# Patient Record
Sex: Male | Born: 1985 | Race: White | Hispanic: No | Marital: Single | Smoking: Former smoker
Health system: Southern US, Community
[De-identification: ages and names within clinical notes are randomized; demographics above are authoritative.]

## PROBLEM LIST (undated history)

## (undated) DIAGNOSIS — F17200 Nicotine dependence, unspecified, uncomplicated: Secondary | ICD-10-CM

## (undated) DIAGNOSIS — M1990 Unspecified osteoarthritis, unspecified site: Secondary | ICD-10-CM

## (undated) DIAGNOSIS — F988 Other specified behavioral and emotional disorders with onset usually occurring in childhood and adolescence: Secondary | ICD-10-CM

## (undated) HISTORY — DX: Other specified behavioral and emotional disorders with onset usually occurring in childhood and adolescence: F98.8

## (undated) HISTORY — DX: Nicotine dependence, unspecified, uncomplicated: F17.200

---
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
## 2005-04-26 ENCOUNTER — Emergency Department (HOSPITAL_COMMUNITY): Admission: EM | Admit: 2005-04-26 | Discharge: 2005-04-26 | Payer: Self-pay | Admitting: Emergency Medicine

## 2005-12-15 ENCOUNTER — Ambulatory Visit (HOSPITAL_COMMUNITY): Admission: RE | Admit: 2005-12-15 | Discharge: 2005-12-15 | Payer: Self-pay | Admitting: Chiropractic Medicine

## 2006-01-30 ENCOUNTER — Ambulatory Visit: Payer: Self-pay | Admitting: Family Medicine

## 2007-02-01 ENCOUNTER — Ambulatory Visit: Payer: Self-pay | Admitting: Family Medicine

## 2009-12-03 ENCOUNTER — Emergency Department (HOSPITAL_COMMUNITY): Admission: EM | Admit: 2009-12-03 | Discharge: 2009-12-03 | Payer: Self-pay | Admitting: Emergency Medicine

## 2010-04-03 ENCOUNTER — Ambulatory Visit (HOSPITAL_COMMUNITY)
Admission: RE | Admit: 2010-04-03 | Discharge: 2010-04-03 | Disposition: A | Discharge status: Home / Self Care | Payer: No Typology Code available for payment source | Source: Ambulatory Visit | Attending: Family Medicine | Admitting: Family Medicine

## 2010-04-03 ENCOUNTER — Inpatient Hospital Stay (INDEPENDENT_AMBULATORY_CARE_PROVIDER_SITE_OTHER)
Admission: RE | Admit: 2010-04-03 | Discharge: 2010-04-03 | Disposition: A | Discharge status: Home / Self Care | Payer: No Typology Code available for payment source | Source: Ambulatory Visit | Attending: Internal Medicine | Admitting: Internal Medicine

## 2010-04-03 DIAGNOSIS — M545 Low back pain, unspecified: Secondary | ICD-10-CM | POA: Insufficient documentation

## 2010-04-03 DIAGNOSIS — S335XXA Sprain of ligaments of lumbar spine, initial encounter: Secondary | ICD-10-CM

## 2010-04-08 ENCOUNTER — Ambulatory Visit (INDEPENDENT_AMBULATORY_CARE_PROVIDER_SITE_OTHER): Payer: Self-pay | Admitting: Family Medicine

## 2010-04-08 DIAGNOSIS — M549 Dorsalgia, unspecified: Secondary | ICD-10-CM

## 2010-04-08 DIAGNOSIS — M542 Cervicalgia: Secondary | ICD-10-CM

## 2010-07-16 NOTE — Procedures (Signed)
HISTORY:  This is a 25 year old patient who is being evaluated for altered  mental status, skull fracture.  This is a portable EEG recording. No skull defects were noted.   MEDICATIONS:  Protonix, Hydrocodone, Dilantin, Tylenol, Zofran.   EEG CLASSIFICATION:  Dysrhythmia grade 1, generalized.   DESCRIPTION OF PROCEDURE:  According to the background rhythms, this  recording consists at time of an 8 to 9 hertz background but at other times  7 hertz background slowing seen. This is a technically difficult study due  to significant head movement artifact, instability at baseline recording for  this reason. There appears to be rapid changes from what appears to be  drowsy/stage 2 sleep to a waking state. At no time during the recording does  there appear to be evidence of obvious spike or spike wave discharges or  other epileptiform discharges.  EKG monitor shows no evidence of cardiac rhythm abnormalities, the heart  rate is 60.  Photic stimulation and hyperventilation were not performed during the  recording.   IMPRESSION:  This is a minimally abnormal EEG recording due to some  generalized background slowing seen. This study does seem to include some  drowsiness and sleeping state. This is a technically difficult study due to  excessive head movement artifact. No epileptiform discharges are seen.       WJX:BJYN  D:  08/27/2004 17:58:19  T:  08/27/2004 20:36:19  Job #:  [id number]
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
## 2010-09-30 ENCOUNTER — Encounter: Payer: Self-pay | Admitting: Family Medicine

## 2012-12-27 ENCOUNTER — Encounter (HOSPITAL_COMMUNITY): Payer: Self-pay | Admitting: Emergency Medicine

## 2012-12-27 ENCOUNTER — Emergency Department (INDEPENDENT_AMBULATORY_CARE_PROVIDER_SITE_OTHER)
Admission: EM | Admit: 2012-12-27 | Discharge: 2012-12-27 | Disposition: A | Discharge status: Home / Self Care | Payer: Self-pay | Source: Home / Self Care | Attending: Family Medicine | Admitting: Family Medicine

## 2012-12-27 DIAGNOSIS — M545 Low back pain: Secondary | ICD-10-CM

## 2012-12-27 MED ORDER — PREDNISONE 10 MG PO KIT: PACK | ORAL | Status: DC

## 2012-12-27 MED ORDER — CYCLOBENZAPRINE HCL 10 MG PO TABS: 10.0000 mg | ORAL_TABLET | Freq: Every evening | ORAL | Status: DC | PRN

## 2012-12-27 MED ORDER — OXYCODONE-ACETAMINOPHEN 5-325 MG PO TABS: 1.0000 | ORAL_TABLET | Freq: Four times a day (QID) | ORAL | Status: DC | PRN

## 2012-12-27 NOTE — ED Notes (Signed)
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 Pain increases w/activity Took vicodin that his mother gave him for the pain w/no relief He is alert w/no signs of acute distress.

## 2012-12-27 NOTE — ED Provider Notes (Signed)
[name] is a 27 y.o. male who presents to Urgent Care today for severe acute low back pain. Patient was attempting to lift a heavy machine yesterday when he felt a sudden pulling sensation in his right low back. He has had subsequent severe low back pain since. He denies any radiating pain weakness numbness bowel bladder dysfunction. He's tried some over-the-counter pain medications as well as some of his mother's Norco which have helped only a little. He has never had pain like this. He feels well otherwise. He is unable to work currently because of pain. Activity makes the pain worse.   Past Medical History  Diagnosis Date  . ADD (attention deficit disorder)   . Smoker    History  Substance Use Topics  . Smoking status: Current Every Day Smoker  . Smokeless tobacco: Not on file  . Alcohol Use: No   ROS as above Medications reviewed. No current facility-administered medications for this encounter.   Current Outpatient Prescriptions  Medication Sig Dispense Refill  . cyclobenzaprine (FLEXERIL) 10 MG tablet Take 1 tablet (10 mg total) by mouth at bedtime as needed for muscle spasms.  20 tablet  0  . oxyCODONE-acetaminophen (PERCOCET/ROXICET) 5-325 MG per tablet Take 1 tablet by mouth every 6 (six) hours as needed for pain.  15 tablet  0  . PredniSONE 10 MG KIT 12 day dose pack po  1 kit  0    Exam:  BP 146/86  Pulse 60  Temp(Src) 98.1 F (36.7 C) (Oral)  Resp 18  SpO2 98% Gen: Well NAD HEENT: EOMI,  MMM Lungs: CTABL Nl WOB Heart: RRR no MRG Abd: NABS, NT, ND Exts: Non edematous BL  LE, warm and well perfused.  Back: Nontender to spinal midline. Tender to palpation right SI joint. Negative straight leg raise test. Followup positive Pearlean Brownie test on right. Strength is intact bilateral lower extremities. Normal hip flexion knee flexion extension and ankle dorsiflexion plantar flexion bilaterally. Patient can stand on toes, heels and squat normally. Range of motion is  reduced with flexion and extension due to pain. Normal rotation. Reflexes are normal and equal bilateral knees and ankles appear Sensation is intact throughout Pulses and capillary refill are intact throughout. Patient walks with an antalgic gait.  No results found for this or any previous visit (from the past 24 hour(s)). No results found.  Assessment and Plan: 27 y.o. male with right lumbago.  Plan to treat with prednisone, Flexeril, oxycodone.  Additional is a heating pad and home exercises. Followup if not improving. Work note provided. Discussed warning signs or symptoms. Please see discharge instructions. Patient expresses understanding. Specifically reviewed incontinence and weakness it is red flag signs or symptoms.      Rodolph Bong, MD 12/27/12 [phone]

## 2013-05-13 ENCOUNTER — Encounter (HOSPITAL_COMMUNITY): Payer: Self-pay | Admitting: Emergency Medicine

## 2013-05-13 ENCOUNTER — Emergency Department (HOSPITAL_COMMUNITY)
Admission: EM | Admit: 2013-05-13 | Discharge: 2013-05-13 | Disposition: A | Discharge status: Home / Self Care | Payer: BC Managed Care – PPO | Source: Home / Self Care | Attending: Emergency Medicine | Admitting: Emergency Medicine

## 2013-05-13 DIAGNOSIS — M25519 Pain in unspecified shoulder: Secondary | ICD-10-CM

## 2013-05-13 MED ORDER — METHYLPREDNISOLONE ACETATE 40 MG/ML IJ SUSP
80.0000 mg | Freq: Once | INTRAMUSCULAR | Status: AC
  Administered 2013-05-13: 80 mg via INTRAMUSCULAR

## 2013-05-13 MED ORDER — ETODOLAC 500 MG PO TABS: 500.0000 mg | ORAL_TABLET | Freq: Two times a day (BID) | ORAL | Status: DC

## 2013-05-13 MED ORDER — METHYLPREDNISOLONE ACETATE 80 MG/ML IJ SUSP
INTRAMUSCULAR | Status: AC
  Filled 2013-05-13: qty 1

## 2013-05-13 MED ORDER — KETOROLAC TROMETHAMINE 60 MG/2ML IM SOLN
60.0000 mg | Freq: Once | INTRAMUSCULAR | Status: AC
  Administered 2013-05-13: 60 mg via INTRAMUSCULAR

## 2013-05-13 MED ORDER — KETOROLAC TROMETHAMINE 60 MG/2ML IM SOLN
INTRAMUSCULAR | Status: AC
  Filled 2013-05-13: qty 2

## 2013-05-13 NOTE — Discharge Instructions (Signed)
   Shoulder Pain The shoulder is the joint that connects your arms to your body. The bones that form the shoulder joint include the upper arm bone (humerus), the shoulder blade (scapula), and the collarbone (clavicle). The top of the humerus is shaped like a ball and fits into a rather flat socket on the scapula (glenoid cavity). A combination of muscles and strong, fibrous tissues that connect muscles to bones (tendons) support your shoulder joint and hold the ball in the socket. Small, fluid-filled sacs (bursae) are located in different areas of the joint. They act as cushions between the bones and the overlying soft tissues and help reduce friction between the gliding tendons and the bone as you move your arm. Your shoulder joint allows a wide range of motion in your arm. This range of motion allows you to do things like scratch your back or throw a ball. However, this range of motion also makes your shoulder more prone to pain from overuse and injury. Causes of shoulder pain can originate from both injury and overuse and usually can be grouped in the following four categories:  Redness, swelling, and pain (inflammation) of the tendon (tendinitis) or the bursae (bursitis).  Instability, such as a dislocation of the joint.  Inflammation of the joint (arthritis).  Broken bone (fracture). HOME CARE INSTRUCTIONS   Apply ice to the sore area.  Put ice in a plastic bag.  Place a towel between your skin and the bag.  Leave the ice on for 15-20 minutes, 03-04 times per day for the first 2 days.  Stop using cold packs if they do not help with the pain.  If you have a shoulder sling or immobilizer, wear it as long as your caregiver instructs. Only remove it to shower or bathe. Move your arm as little as possible, but keep your hand moving to prevent swelling.  Squeeze a soft ball or foam pad as much as possible to help prevent swelling.  Only take over-the-counter or prescription medicines for  pain, discomfort, or fever as directed by your caregiver. SEEK MEDICAL CARE IF:   Your shoulder pain increases, or new pain develops in your arm, hand, or fingers.  Your hand or fingers become cold and numb.  Your pain is not relieved with medicines. SEEK IMMEDIATE MEDICAL CARE IF:   Your arm, hand, or fingers are numb or tingling.  Your arm, hand, or fingers are significantly swollen or turn white or blue. MAKE SURE YOU:   Understand these instructions.  Will watch your condition.  Will get help right away if you are not doing well or get worse. Document Released: 11/24/2004 Document Revised: 11/09/2011 Document Reviewed: 01/29/2011 [employer] Patient Information 2014 [employer].  

## 2013-05-13 NOTE — ED Provider Notes (Signed)
Medical screening examination/treatment/procedure(s) were performed by non-physician practitioner and as supervising physician I was immediately available for consultation/collaboration.  Jaime Dome, M.D.  Jeoffrey Eleazer C Jalessa Peyser, MD 05/13/13 [id number] 
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
## 2013-05-13 NOTE — ED Provider Notes (Signed)
CSN: [id number]     Arrival date & time 05/13/13  1703 History   First MD Initiated Contact with Patient 05/13/13 1900     Chief Complaint  Patient presents with  . Arm Pain   (Consider location/radiation/quality/duration/timing/severity/associated sxs/prior Treatment) HPI Comments: 28 year old male presents complaining of left shoulder pain. This pain has been present for 3 weeks. 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 He has pain in the shoulder that is much worse with any overhead movement. He denies any numbness or weakness in the arm. He denies any specific injury apart from possible overuse injury. He has a history of injuring his rotator cuff and shoulder many years ago, this was treated by his orthopedist, he does not currently have an orthopedic physician.  Patient is a 28 y.o. male presenting with arm pain.  Arm Pain Pertinent negatives include no chest pain, no abdominal pain and no shortness of breath.    Past Medical History  Diagnosis Date  . ADD (attention deficit disorder)   . Smoker    History reviewed. No pertinent past surgical history. Family History  Problem Relation Age of Onset  . Hypertension Mother   . Arthritis Maternal Grandmother   . Hypertension Maternal Grandmother   . Arthritis Maternal Grandfather   . Cancer Maternal Grandfather   . Hypertension Paternal Grandmother    History  Substance Use Topics  . Smoking status: Current Every Day Smoker  . Smokeless tobacco: Not on file  . Alcohol Use: No    Review of Systems  Constitutional: Negative for fever, chills and fatigue.  HENT: Negative for sore throat.   Eyes: Negative for visual disturbance.  Respiratory: Negative for cough and shortness of breath.   Cardiovascular: Negative for chest pain, palpitations and leg swelling.  Gastrointestinal: Negative for nausea, vomiting, abdominal pain, diarrhea and constipation.  Genitourinary: Negative for dysuria, urgency,  frequency and hematuria.  Musculoskeletal: Positive for arthralgias and neck pain. Negative for myalgias and neck stiffness.  Skin: Negative for rash.  Neurological: Negative for dizziness, weakness and light-headedness.    Allergies  Review of patient's allergies indicates no known allergies.  Home Medications   Current Outpatient Rx  Name  Route  Sig  Dispense  Refill  . cyclobenzaprine (FLEXERIL) 10 MG tablet   Oral   Take 1 tablet (10 mg total) by mouth at bedtime as needed for muscle spasms.   20 tablet   0   . etodolac (LODINE) 500 MG tablet   Oral   Take 1 tablet (500 mg total) by mouth 2 (two) times daily.   30 tablet   1   . oxyCODONE-acetaminophen (PERCOCET/ROXICET) 5-325 MG per tablet   Oral   Take 1 tablet by mouth every 6 (six) hours as needed for pain.   15 tablet   0   . PredniSONE 10 MG KIT      12 day dose pack po   1 kit   0    BP 135/82  Pulse 79  Temp(Src) 98 F (36.7 C) (Oral)  Resp 20  SpO2 97% Physical Exam  Nursing note and vitals reviewed. Constitutional: He is oriented to person, place, and time. He appears well-developed and well-nourished. No distress.  HENT:  Head: Normocephalic.  Pulmonary/Chest: Effort normal. No respiratory distress.  Musculoskeletal:       Left shoulder: He exhibits decreased range of motion (Decreased internal and external rotation and abduction past 90), tenderness (shoulder girdle) and pain. He exhibits no  bony tenderness, no swelling, no effusion, no deformity and no spasm.  Positive for shoulder apprehension test  Neurological: He is alert and oriented to person, place, and time. Coordination normal.  Skin: Skin is warm and dry. No rash noted. He is not diaphoretic.  Psychiatric: He has a normal mood and affect. Judgment normal.    ED Course  Procedures (including critical care time) Labs Review Labs Reviewed - No data to display Imaging Review No results found.   MDM   1. Shoulder pain     Given Toradol and Depo-Medrol here, start on twice a day Lodine, followup with orthopedics in a week.  Meds ordered this encounter  Medications  . ketorolac (TORADOL) injection 60 mg    Sig:   . methylPREDNISolone acetate (DEPO-MEDROL) injection 80 mg    Sig:   . etodolac (LODINE) 500 MG tablet    Sig: Take 1 tablet (500 mg total) by mouth 2 (two) times daily.    Dispense:  30 tablet    Refill:  1    Order Specific Question:  Supervising Provider    Answer:  Ihor Gully D [place], PA-C 05/13/13 [phone]

## 2013-08-21 ENCOUNTER — Encounter (HOSPITAL_COMMUNITY): Payer: Self-pay | Admitting: Emergency Medicine

## 2013-08-21 ENCOUNTER — Emergency Department (HOSPITAL_COMMUNITY)
Admission: EM | Admit: 2013-08-21 | Discharge: 2013-08-21 | Disposition: A | Discharge status: Home / Self Care | Payer: BC Managed Care – PPO | Source: Home / Self Care | Attending: Emergency Medicine | Admitting: Emergency Medicine

## 2013-08-21 DIAGNOSIS — G2581 Restless legs syndrome: Secondary | ICD-10-CM

## 2013-08-21 DIAGNOSIS — M719 Bursopathy, unspecified: Secondary | ICD-10-CM

## 2013-08-21 DIAGNOSIS — M7580 Other shoulder lesions, unspecified shoulder: Secondary | ICD-10-CM

## 2013-08-21 DIAGNOSIS — J069 Acute upper respiratory infection, unspecified: Secondary | ICD-10-CM

## 2013-08-21 DIAGNOSIS — M67919 Unspecified disorder of synovium and tendon, unspecified shoulder: Secondary | ICD-10-CM

## 2013-08-21 DIAGNOSIS — M5430 Sciatica, unspecified side: Secondary | ICD-10-CM

## 2013-08-21 MED ORDER — ROPINIROLE HCL 0.25 MG PO TABS: 0.2500 mg | ORAL_TABLET | Freq: Every day | ORAL | Status: DC

## 2013-08-21 MED ORDER — MELOXICAM 15 MG PO TABS: 15.0000 mg | ORAL_TABLET | Freq: Every day | ORAL | Status: DC

## 2013-08-21 MED ORDER — IPRATROPIUM BROMIDE 0.06 % NA SOLN: 2.0000 | Freq: Four times a day (QID) | NASAL | Status: DC

## 2013-08-21 MED ORDER — HYDROCODONE-ACETAMINOPHEN 5-325 MG PO TABS: ORAL_TABLET | ORAL | Status: DC

## 2013-08-21 MED ORDER — HYDROCODONE-ACETAMINOPHEN 5-325 MG PO TABS
ORAL_TABLET | ORAL | Status: AC
  Filled 2013-08-21: qty 2

## 2013-08-21 MED ORDER — HYDROCODONE-ACETAMINOPHEN 5-325 MG PO TABS
2.0000 | ORAL_TABLET | Freq: Once | ORAL | Status: AC
  Administered 2013-08-21: 2 via ORAL

## 2013-08-21 MED ORDER — TIZANIDINE HCL 4 MG PO CAPS: 4.0000 mg | ORAL_CAPSULE | Freq: Three times a day (TID) | ORAL | Status: DC | PRN

## 2013-08-21 MED ORDER — HYDROCOD POLST-CHLORPHEN POLST 10-8 MG/5ML PO LQCR: 5.0000 mL | Freq: Two times a day (BID) | ORAL | Status: DC | PRN

## 2013-08-21 NOTE — Discharge Instructions (Signed)
Do exercises twice daily followed by moist heat for 15 minutes. ° ° ° ° ° °Try to be as active as possible. ° °If no better in 2 weeks, follow up with orthopedist. ° °Most shoulder pain is caused by soft tissue problems rather than arthritis.  Rotator cuff tendonitis or tendonosis, rotator cuff tears, impingement syndrome and cartilege (labrum tears) are a few of the common causes of shoulder pain.  Fortunately, most of these can be treated with conservative measures as outlined below. ° °Do not do the following: °· Doing any work with the arms above shoulder level (especially lifting) until the pain has subsided. °· Sleeping on the affected side.  Especially avoid sleeping with your arm under your head or your pillow.  This is a habit that is hard to break.  Some people have to pin the arm of their pajamas to the chest area to prevent this. ° °Do the following: °· Do the shoulder exercises below twice daily followed by ice for 10 minutes. °· If no better in 1 month, follow up here, with your primary care doctor, or with an orthopedist. °· Use of over the counter pain meds can be of help.  Tylenol (or acetaminophen) is the safest to use.  It often helps to take this regularly.  You can take up to 2 325 mg tablets 5 times daily, but it best to start out much lower that that, perhaps 2 325 mg tablets twice daily, then increase from there. People who are on the blood thinner warfarin have to be careful about taking high doses of Tylenol.  For people who are able to tolerate them, ibuprofen and Aleve can also help with the pain.  You should discuss these agents with your physician before taking them.  People with chronic kidney disease, hypertension, peptic ulcer disease, and reflux can suffer adverse side effects. They should not be taken with warfarin. The maximum dosage of ibuprofen is 800 mg 3 times daily with meals.  The maximum dosage of Aleve is 2 and 1/2 tablets twice daily with food, but again, start out low  and gradually increase the dose until adequate pain relief is achieved. Ibuprofen and Aleve should always be taken with food. ° ° ° ° ° ° °
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
## 2013-08-21 NOTE — ED Provider Notes (Signed)
Chief Complaint   Chief Complaint  Patient presents with  . Joint Pain    History of Present Illness   [name] is a 28 year old male who presents with several issues today: Cough, back pain, restless legs at night, and bilateral shoulder pain. The patient states for the past 2 days he's had a cough productive yellow-green sputum, wheezing, chest tightness, chest pain when he coughs. He's also had sore throat, rhinorrhea, and chills. He is a cigarette smoker. His wife has had something similar. Also he the past 3 months he's had lower back pain rated as 7-9 or 10 in intensity. He's been here before and received Depo-Medrol and Toradol without relief. The pain radiates down into both legs and his legs feel numb and weak. It's worse with movement of the legs, bending, lifting, twisting. He has restless legs at nighttime and notes aching in his legs and both of his shoulders. He states he feels like both shoulders are being torn other psychosomatic. He can't sit up straight for any length of time and the symptoms tend to be worse at nighttime. His primary care doctor is Dr. Sharlot GowdaJohn Lalonde, but he has not seen him for this problem. He denies any fever, chills, unintended weight loss, abdominal pain, bladder or bowel dysfunction, or saddle anesthesia.  Review of Systems   Other than as noted above, the patient denies any of the following symptoms: Systemic:  No fever, chills, or unexplained weight loss. GI:  No abdominal painor incontinence of bowel. GU:  No dysuria, frequency, urgency, or hematuria. No incontinence of urine or urinary retention.  M-S:  No neck pain or arthritis. Neuro:  No paresthesias, headache, saddle anesthesia, muscular weakness, or progressive neurological deficit.  PMFSH   Past medical history, family history, social history, meds, and allergies were reviewed. Specifically, there is no history of cancer, major trauma, osteoporosis, immunosuppression, or HIV  infection.   Physical Examination    Vital signs:  BP 120/79  Pulse 92  Temp(Src) 99.5 F (37.5 C) (Oral)  Resp 14  SpO2 98% General:  Alert, oriented, in no distress. ENT: TMs, nasal mucosa, and oropharynx are clear. Lungs: Clear to auscultation. Heart: Regular rhythm, no gallop or murmur. Abdomen:  Soft, non-tender.  No organomegaly or mass.  No pulsatile midline abdominal mass or bruit. Back:  Lower back was tender to palpation. His back has a diminished range of motion with pain in all directions. Straight leg raising was positive bilaterally. Neuro:  Normal muscle strength, sensations and DTRs. Extremities: Pedal pulses were full, there was no edema. Exam of the shoulders reveals pain to palpation, full range of motion but with pain on abduction and positive impingement signs. Skin:  Clear, warm and dry.  No rash.  Course in Urgent Care Center   He was given Norco 5/325 2 by mouth for pain.    Assessment   The primary encounter diagnosis was Sciatica, unspecified laterality. Diagnoses of Restless leg syndrome, Viral URI, and Rotator cuff tendonitis, unspecified laterality were also pertinent to this visit.  No evidence of cauda equina syndrome, discitis, epidural abscess, or aneurism.    Plan     1.  Meds:  The following meds were prescribed:   Discharge Medication List as of 08/21/2013 11:54 AM    START taking these medications   Details  chlorpheniramine-HYDROcodone (TUSSIONEX) 10-8 MG/5ML LQCR Take 5 mLs by mouth every 12 (twelve) hours as needed for cough., Starting 08/21/2013, Until Discontinued, Print    HYDROcodone-acetaminophen (  NORCO/VICODIN) 5-325 MG per tablet 1 to 2 tabs every 4 to 6 hours as needed for pain., Print    ipratropium (ATROVENT) 0.06 % nasal spray Place 2 sprays into both nostrils 4 (four) times daily., Starting 08/21/2013, Until Discontinued, Normal    meloxicam (MOBIC) 15 MG tablet Take 1 tablet (15 mg total) by mouth daily., Starting 08/21/2013,  Until Discontinued, Normal    rOPINIRole (REQUIP) 0.25 MG tablet Take 1 tablet (0.25 mg total) by mouth at bedtime., Starting 08/21/2013, Until Discontinued, Normal    tiZANidine (ZANAFLEX) 4 MG capsule Take 1 capsule (4 mg total) by mouth 3 (three) times daily as needed for muscle spasms., Starting 08/21/2013, Until Discontinued, Normal        2.  Patient Education/Counseling:  The patient was given appropriate handouts, self care instructions, and instructed in symptomatic relief. The patient was encouraged to try to be as active as possible and given some exercises to do followed by moist heat. Suggested followup with Dr. Roda ShuttersXu for his back and shoulder problems. I think he'll need an MRI scan of both. Also suggest he followup with Dr. Sharlot GowdaJohn Lalonde for the restless leg syndrome.  3.  Follow up:  The patient was told to follow up here if no better in 3 to 4 days, or sooner if becoming worse in any way, and given some red flag symptoms such as worsening pain or new neurological symptoms which would prompt immediate return.     Reuben Likesavid C Keller, MD 08/21/13 [phone]

## 2013-09-16 ENCOUNTER — Encounter (HOSPITAL_COMMUNITY): Payer: Self-pay | Admitting: Emergency Medicine

## 2013-09-16 ENCOUNTER — Emergency Department (HOSPITAL_COMMUNITY)
Admission: EM | Admit: 2013-09-16 | Discharge: 2013-09-16 | Disposition: A | Discharge status: Home / Self Care | Payer: BC Managed Care – PPO | Attending: Emergency Medicine | Admitting: Emergency Medicine

## 2013-09-16 DIAGNOSIS — R197 Diarrhea, unspecified: Secondary | ICD-10-CM | POA: Insufficient documentation

## 2013-09-16 DIAGNOSIS — Z8659 Personal history of other mental and behavioral disorders: Secondary | ICD-10-CM | POA: Insufficient documentation

## 2013-09-16 DIAGNOSIS — R112 Nausea with vomiting, unspecified: Secondary | ICD-10-CM | POA: Insufficient documentation

## 2013-09-16 DIAGNOSIS — Z87891 Personal history of nicotine dependence: Secondary | ICD-10-CM | POA: Insufficient documentation

## 2013-09-16 DIAGNOSIS — Z791 Long term (current) use of non-steroidal anti-inflammatories (NSAID): Secondary | ICD-10-CM | POA: Insufficient documentation

## 2013-09-16 LAB — CBC WITH DIFFERENTIAL/PLATELET
BASOS ABS: 0 10*3/uL (ref 0.0–0.1)
Basophils Relative: 0 % (ref 0–1)
Eosinophils Absolute: 0.3 10*3/uL (ref 0.0–0.7)
Eosinophils Relative: 5 % (ref 0–5)
HEMATOCRIT: 40.4 % (ref 39.0–52.0)
Hemoglobin: 14 g/dL (ref 13.0–17.0)
Lymphocytes Relative: 35 % (ref 12–46)
Lymphs Abs: 2.4 10*3/uL (ref 0.7–4.0)
MCH: 32.5 pg (ref 26.0–34.0)
MCHC: 34.7 g/dL (ref 30.0–36.0)
MCV: 93.7 fL (ref 78.0–100.0)
Monocytes Absolute: 0.4 10*3/uL (ref 0.1–1.0)
Monocytes Relative: 7 % (ref 3–12)
NEUTROS ABS: 3.6 10*3/uL (ref 1.7–7.7)
Neutrophils Relative %: 53 % (ref 43–77)
Platelets: 168 10*3/uL (ref 150–400)
RBC: 4.31 MIL/uL (ref 4.22–5.81)
RDW: 12.1 % (ref 11.5–15.5)
WBC: 6.8 10*3/uL (ref 4.0–10.5)

## 2013-09-16 LAB — COMPREHENSIVE METABOLIC PANEL
ALK PHOS: 87 U/L (ref 39–117)
ALT: 22 U/L (ref 0–53)
ANION GAP: 14 (ref 5–15)
AST: 16 U/L (ref 0–37)
Albumin: 3.8 g/dL (ref 3.5–5.2)
BUN: 10 mg/dL (ref 6–23)
CO2: 23 mEq/L (ref 19–32)
Calcium: 8.4 mg/dL (ref 8.4–10.5)
Chloride: 103 mEq/L (ref 96–112)
Creatinine, Ser: 0.72 mg/dL (ref 0.50–1.35)
GFR calc Af Amer: >90 mL/min (ref >90)
GFR calc non Af Amer: >90 mL/min (ref >90)
Glucose, Bld: 97 mg/dL (ref 70–99)
Potassium: 4 mEq/L (ref 3.7–5.3)
Sodium: 140 mEq/L (ref 137–147)
Total Bilirubin: 0.4 mg/dL (ref 0.3–1.2)
Total Protein: 7 g/dL (ref 6.0–8.3)

## 2013-09-16 MED ORDER — SODIUM CHLORIDE 0.9 % IV BOLUS (SEPSIS)
1000.0000 mL | Freq: Once | INTRAVENOUS | Status: AC
  Administered 2013-09-16: 1000 mL via INTRAVENOUS

## 2013-09-16 MED ORDER — ONDANSETRON HCL 4 MG PO TABS: 4.0000 mg | ORAL_TABLET | Freq: Four times a day (QID) | ORAL | Status: DC

## 2013-09-16 MED ORDER — ONDANSETRON HCL 4 MG/2ML IJ SOLN
4.0000 mg | Freq: Once | INTRAMUSCULAR | Status: AC
  Administered 2013-09-16: 4 mg via INTRAVENOUS
  Filled 2013-09-16: qty 2

## 2013-09-16 MED ORDER — MORPHINE SULFATE 4 MG/ML IJ SOLN
4.0000 mg | Freq: Once | INTRAMUSCULAR | Status: AC
  Administered 2013-09-16: 4 mg via INTRAVENOUS
  Filled 2013-09-16: qty 1

## 2013-09-16 MED ORDER — DICYCLOMINE HCL 20 MG PO TABS: 20.0000 mg | ORAL_TABLET | Freq: Two times a day (BID) | ORAL | Status: DC

## 2013-09-16 NOTE — ED Provider Notes (Signed)
Medical screening examination/treatment/procedure(s) were performed by non-physician practitioner and as supervising physician I was immediately available for consultation/collaboration.   EKG Interpretation None        Shanna CiscoMegan E Docherty, MD 09/16/13 [phone]

## 2013-09-16 NOTE — ED Provider Notes (Signed)
CSN: [id number]     Arrival date & time 09/16/13  0701 History   First MD Initiated Contact with Patient 09/16/13 [phone]     Chief Complaint  Patient presents with  . Abdominal Pain     (Consider location/radiation/quality/duration/timing/severity/associated sxs/prior Treatment) Patient is a 28 y.o. male presenting with abdominal pain. The history is provided by the patient and medical records.  Abdominal Pain Associated symptoms: diarrhea, nausea and vomiting    This is a 28 year old male with past medical history significant for ADD, presenting to the ED for gastroenteritis type symptoms. Specifically he has developed nausea, vomiting, and abdominal cramping over the past 2 days. His wife is sick with similar symptoms. He denies any fever or chills. One episode of nonbloody diarrhea earlier this morning.  He did have one episode of nonbloody, nonbilious emesis this morning as well. He's been taking over-the-counter Pepto-Bismol and Zantac without noted improvement.  States his stomach is "tied in knots."  No hx of GI issues.  No prior abdominal surgeries.  VS stable on arrival.  Past Medical History  Diagnosis Date  . ADD (attention deficit disorder)   . Smoker    History reviewed. No pertinent past surgical history. Family History  Problem Relation Age of Onset  . Hypertension Mother   . Arthritis Maternal Grandmother   . Hypertension Maternal Grandmother   . Arthritis Maternal Grandfather   . Cancer Maternal Grandfather   . Hypertension Paternal Grandmother    History  Substance Use Topics  . Smoking status: Former Games developer  . Smokeless tobacco: Not on file  . Alcohol Use: Yes     Comment: occasionally    Review of Systems  Gastrointestinal: Positive for nausea, vomiting, abdominal pain (cramping) and diarrhea.  All other systems reviewed and are negative.     Allergies  Review of patient's allergies indicates no known allergies.  Home Medications   Prior to  Admission medications   Medication Sig Start Date End Date Taking? Authorizing Provider  bismuth subsalicylate (PEPTO BISMOL) 262 MG/15ML suspension Take 30 mLs by mouth every 6 (six) hours as needed for indigestion.   Yes Historical Provider, MD  meloxicam (MOBIC) 15 MG tablet Take 1 tablet (15 mg total) by mouth daily. 08/21/13  Yes Reuben Likes, MD  Ranitidine HCl (ACID REDUCER PO) Take 1 tablet by mouth daily as needed (for stomach acid-OTC).   Yes Historical Provider, MD  rOPINIRole (REQUIP) 0.25 MG tablet Take 1 tablet (0.25 mg total) by mouth at bedtime. 08/21/13  Yes Reuben Likes, MD  tiZANidine (ZANAFLEX) 4 MG capsule Take 1 capsule (4 mg total) by mouth 3 (three) times daily as needed for muscle spasms. 08/21/13  Yes Reuben Likes, MD   BP 141/99  Pulse 73  Temp(Src) 97.5 F (36.4 C) (Oral)  Resp 22  SpO2 99%  Physical Exam  Nursing note and vitals reviewed. Constitutional: He is oriented to person, place, and time. He appears well-developed and well-nourished. No distress.  HENT:  Head: Normocephalic and atraumatic.  Mouth/Throat: Uvula is midline, oropharynx is clear and moist and mucous membranes are normal.  Mildly dry mucous membranes  Eyes: Conjunctivae and EOM are normal. Pupils are equal, round, and reactive to light.  Neck: Normal range of motion.  Cardiovascular: Normal rate, regular rhythm and normal heart sounds.   Pulmonary/Chest: Effort normal and breath sounds normal.  Abdominal: Soft. Bowel sounds are normal. There is no tenderness. There is no guarding.  Abdomen soft, nondistended, no focal  tenderness or peritoneal signs  Musculoskeletal: Normal range of motion.  Neurological: He is alert and oriented to person, place, and time.  Skin: Skin is warm and dry.  Psychiatric: He has a normal mood and affect.    ED Course  Procedures (including critical care time) Labs Review Labs Reviewed  CBC WITH DIFFERENTIAL  COMPREHENSIVE METABOLIC PANEL     Imaging Review No results found.   EKG Interpretation None      MDM   Final diagnoses:  Nausea vomiting and diarrhea   28 year old male with gastroenteritis type symptoms, wife sick with the same. On exam patient is afebrile and overall nontoxic appearing. His abdominal exam is benign. His mucous membranes are mildly dry, but he does not appear severely dehydrated. Will obtain basic labs. IV fluids, medication, and antibiotics given.  Blood work is unremarkable. After fluids and meds patient states he is feeling better.  Abdominal exam remains benign.  He has tolerated PO sprite without difficulty.  Will d/c home with supportive care including bentyl and zofran.  Encouraged fluids and progress diet back to normal as tolerated.  Discussed plan with patient, he/she acknowledged understanding and agreed with plan of care.  Return precautions given for new or worsening symptoms.  Garlon HatchetLisa M Sanders, PA-C 09/16/13 1105

## 2013-09-16 NOTE — ED Notes (Signed)
Pt states that he has been nauseated and abdominal pain starting this morning. Pt states that his wife has been sick with same recently.

## 2013-09-16 NOTE — Discharge Instructions (Signed)
Take the prescribed medication as directed. Drink plenty of fluids to keep yourself hydrated. Follow-up with your primary care physician. Return to the ED for new or worsening symptoms.

## 2014-11-04 ENCOUNTER — Emergency Department (HOSPITAL_COMMUNITY)
Admission: EM | Admit: 2014-11-04 | Discharge: 2014-11-04 | Disposition: A | Discharge status: Home / Self Care | Payer: Self-pay | Attending: Emergency Medicine | Admitting: Emergency Medicine

## 2014-11-04 DIAGNOSIS — Z87891 Personal history of nicotine dependence: Secondary | ICD-10-CM | POA: Insufficient documentation

## 2014-11-04 DIAGNOSIS — T24122A Burn of first degree of left knee, initial encounter: Secondary | ICD-10-CM | POA: Insufficient documentation

## 2014-11-04 DIAGNOSIS — Y92194 Driveway of other specified residential institution as the place of occurrence of the external cause: Secondary | ICD-10-CM | POA: Insufficient documentation

## 2014-11-04 DIAGNOSIS — Y990 Civilian activity done for income or pay: Secondary | ICD-10-CM | POA: Insufficient documentation

## 2014-11-04 DIAGNOSIS — T23101A Burn of first degree of right hand, unspecified site, initial encounter: Secondary | ICD-10-CM

## 2014-11-04 DIAGNOSIS — Y278XXA Contact with other hot objects, undetermined intent, initial encounter: Secondary | ICD-10-CM | POA: Insufficient documentation

## 2014-11-04 DIAGNOSIS — T23191A Burn of first degree of multiple sites of right wrist and hand, initial encounter: Secondary | ICD-10-CM | POA: Insufficient documentation

## 2014-11-04 DIAGNOSIS — Z8659 Personal history of other mental and behavioral disorders: Secondary | ICD-10-CM | POA: Insufficient documentation

## 2014-11-04 DIAGNOSIS — Y9389 Activity, other specified: Secondary | ICD-10-CM | POA: Insufficient documentation

## 2014-11-04 MED ORDER — KETOROLAC TROMETHAMINE 10 MG PO TABS
10.0000 mg | ORAL_TABLET | Freq: Once | ORAL | Status: AC
  Administered 2014-11-04: 10 mg via ORAL
  Filled 2014-11-04: qty 1

## 2014-11-04 MED ORDER — ACETAMINOPHEN 325 MG PO TABS
650.0000 mg | ORAL_TABLET | Freq: Once | ORAL | Status: AC
  Administered 2014-11-04: 650 mg via ORAL
  Filled 2014-11-04: qty 2

## 2014-11-04 MED ORDER — MELOXICAM 15 MG PO TABS: 15.0000 mg | ORAL_TABLET | Freq: Every day | ORAL | Status: DC

## 2014-11-04 MED ORDER — BACITRACIN-NEOMYCIN-POLYMYXIN 400-5-5000 EX OINT
TOPICAL_OINTMENT | Freq: Once | CUTANEOUS | Status: AC
  Administered 2014-11-04: 1 via TOPICAL
  Filled 2014-11-04: qty 1

## 2014-11-04 MED ORDER — TRAMADOL HCL 50 MG PO TABS: ORAL_TABLET | ORAL | Status: DC

## 2014-11-04 NOTE — ED Notes (Signed)
Burn to inside of right hand and left knee. Pt working outside today and using equipment in driveway and burned himself

## 2014-11-04 NOTE — ED Provider Notes (Signed)
CSN: [id number]     Arrival date & time 11/04/14  1310 History   First MD Initiated Contact with Patient 11/04/14 1437     Chief Complaint  Patient presents with  . Hand Burn     (Consider location/radiation/quality/duration/timing/severity/associated sxs/prior Treatment) HPI Comments: Patient states that earlier today he was working with a piece of equipment that was quite hot. He burned his right hand, and also had a mild burn to the left knee. He presents to the emergency department now for assistance of this burn. No other areas of burn were reported. The patient time has not noted a blistering at this time.  The history is provided by the patient.    Past Medical History  Diagnosis Date  . ADD (attention deficit disorder)   . Smoker    No past surgical history on file. Family History  Problem Relation Age of Onset  . Hypertension Mother   . Arthritis Maternal Grandmother   . Hypertension Maternal Grandmother   . Arthritis Maternal Grandfather   . Cancer Maternal Grandfather   . Hypertension Paternal Grandmother    Social History  Substance Use Topics  . Smoking status: Former Games developer  . Smokeless tobacco: Not on file  . Alcohol Use: Yes     Comment: occasionally    Review of Systems  Skin: Positive for wound.  All other systems reviewed and are negative.     Allergies  Review of patient's allergies indicates no known allergies.  Home Medications   Prior to Admission medications   Medication Sig Start Date End Date Taking? Authorizing Provider  dicyclomine (BENTYL) 20 MG tablet Take 1 tablet (20 mg total) by mouth 2 (two) times daily. Patient not taking: Reported on 11/04/2014 09/16/13   Garlon Hatchet, PA-C  meloxicam (MOBIC) 15 MG tablet Take 1 tablet (15 mg total) by mouth daily. 11/04/14   Ivery Quale, PA-C  ondansetron (ZOFRAN) 4 MG tablet Take 1 tablet (4 mg total) by mouth every 6 (six) hours. Patient not taking: Reported on 11/04/2014 09/16/13   Garlon Hatchet, PA-C  traMADol Janean Sark) 50 MG tablet 1 or 2 po q6h prn pain 11/04/14   Ivery Quale, PA-C   BP 138/82 mmHg  Pulse 90  Temp(Src) 98 F (36.7 C)  Resp 18  Ht 6\' 4"  (1.93 m)  Wt 260 lb (117.935 kg)  BMI 31.66 kg/m2  SpO2 100% Physical Exam  Constitutional: He is oriented to person, place, and time. He appears well-developed and well-nourished.  Non-toxic appearance.  HENT:  Head: Normocephalic.  Right Ear: Tympanic membrane and external ear normal.  Left Ear: Tympanic membrane and external ear normal.  Eyes: EOM and lids are normal. Pupils are equal, round, and reactive to light.  Neck: Normal range of motion. Neck supple. Carotid bruit is not present.  Cardiovascular: Normal rate, regular rhythm, normal heart sounds, intact distal pulses and normal pulses.   Pulmonary/Chest: Breath sounds normal. No respiratory distress.  Abdominal: Soft. Bowel sounds are normal. There is no tenderness. There is no guarding.  Musculoskeletal: Normal range of motion.  Lymphadenopathy:       Head (right side): No submandibular adenopathy present.       Head (left side): No submandibular adenopathy present.    He has no cervical adenopathy.  Neurological: He is alert and oriented to person, place, and time. He has normal strength. No cranial nerve deficit or sensory deficit.  Skin: Skin is warm and dry.  First degree burns of  present of the tips of the second, third, fourth, and fifth finger. First and second-degree burns at the palmar surface metacarpal head area. And 1 first-degree area of the thenar area.  There is a first degree burn of the medial aspect of the left knee.  Psychiatric: He has a normal mood and affect. His speech is normal.  Nursing note and vitals reviewed.   ED Course  Procedures (including critical care time) Labs Review Labs Reviewed - No data to display  Imaging Review No results found. I have personally reviewed and evaluated these images and lab results as  part of my medical decision-making.   EKG Interpretation None      MDM  There is first to second-degree burns involving the right hand. There is a first degree burn involving the left knee. The patient is advised to apply a bulky dressing to the hand. He will use Neosporin for now. He is to follow-up with his primary physician or return to the emergency department if any emergent changes or problems. He has been provided with an ice pack. A prescription for back and Ultram have been given to the patient.    Final diagnoses:  Burn, hand, first degree, right, initial encounter    **I have reviewed nursing notes, vital signs, and all appropriate lab and imaging results for this patient.Ivery Quale, PA-C 11/04/14 1458  Bethann Berkshire, MD 11/04/14 [phone]

## 2014-11-04 NOTE — Discharge Instructions (Signed)
Burn Care °Burns hurt your skin. When your skin is hurt, it is easier to get an infection. Follow your doctor's directions to help prevent an infection. °HOME CARE °· Wash your hands well before you change your bandage. °· Change your bandage as often as told by your doctor. °¨ Remove the old bandage. If the bandage sticks, soak it off with cool, clean water. °¨ Gently clean the burn with mild soap and water. °¨ Pat the burn dry with a clean, dry cloth. °¨ Put a thin layer of medicated cream on the burn. °¨ Put a clean bandage on as told by your doctor. °¨ Keep the bandage clean and dry. °· Raise (elevate) the burn for the first 24 hours. After that, follow your doctor's directions. °· Only take medicine as told by your doctor. °GET HELP RIGHT AWAY IF:  °· You have too much pain. °· The skin near the burn is red, tender, puffy (swollen), or has red streaks. °· The burn area has yellowish white fluid (pus) or a bad smell coming from it. °· You have a fever. °MAKE SURE YOU:  °· Understand these instructions. °· Will watch your condition. °· Will get help right away if you are not doing well or get worse. °Document Released: 11/24/2007 Document Revised: 05/09/2011 Document Reviewed: 07/07/2010 °Exit[employer]® Patient Information ©2015 [employer]. This information is not intended to replace advice given to you by your health care provider. Make sure you discuss any questions you have with your health care provider. ° °

## 2016-08-03 ENCOUNTER — Emergency Department (HOSPITAL_COMMUNITY): Payer: 59

## 2016-08-03 ENCOUNTER — Emergency Department (HOSPITAL_COMMUNITY)
Admission: EM | Admit: 2016-08-03 | Discharge: 2016-08-03 | Disposition: A | Discharge status: Home / Self Care | Payer: 59 | Attending: Emergency Medicine | Admitting: Emergency Medicine

## 2016-08-03 ENCOUNTER — Encounter (HOSPITAL_COMMUNITY): Payer: Self-pay | Admitting: Emergency Medicine

## 2016-08-03 DIAGNOSIS — Y9289 Other specified places as the place of occurrence of the external cause: Secondary | ICD-10-CM | POA: Insufficient documentation

## 2016-08-03 DIAGNOSIS — Z87891 Personal history of nicotine dependence: Secondary | ICD-10-CM | POA: Diagnosis not present

## 2016-08-03 DIAGNOSIS — W228XXA Striking against or struck by other objects, initial encounter: Secondary | ICD-10-CM | POA: Insufficient documentation

## 2016-08-03 DIAGNOSIS — Y939 Activity, unspecified: Secondary | ICD-10-CM | POA: Insufficient documentation

## 2016-08-03 DIAGNOSIS — Y990 Civilian activity done for income or pay: Secondary | ICD-10-CM | POA: Diagnosis not present

## 2016-08-03 DIAGNOSIS — S20212A Contusion of left front wall of thorax, initial encounter: Secondary | ICD-10-CM | POA: Diagnosis not present

## 2016-08-03 DIAGNOSIS — S29001A Unspecified injury of muscle and tendon of front wall of thorax, initial encounter: Secondary | ICD-10-CM | POA: Diagnosis present

## 2016-08-03 DIAGNOSIS — R52 Pain, unspecified: Secondary | ICD-10-CM

## 2016-08-03 HISTORY — DX: Unspecified osteoarthritis, unspecified site: M19.90

## 2016-08-03 MED ORDER — OXYCODONE-ACETAMINOPHEN 5-325 MG PO TABS: 1.0000 | ORAL_TABLET | ORAL | 0 refills | Status: DC | PRN

## 2016-08-03 MED ORDER — OXYCODONE-ACETAMINOPHEN 5-325 MG PO TABS
1.0000 | ORAL_TABLET | Freq: Once | ORAL | Status: AC
  Administered 2016-08-03: 1 via ORAL
  Filled 2016-08-03: qty 1

## 2016-08-03 NOTE — ED Triage Notes (Signed)
Around noon, pt was standing on monkey bars and leg slipped through and fell forward on bar landing on his chest, knocking the air out of himself, bruising to chest. Difficulty to take deep breaths

## 2016-08-03 NOTE — ED Notes (Signed)
Pt taken to xray 

## 2016-08-03 NOTE — ED Notes (Signed)
Pt states this happened at work, was hit by a vacuum. 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 Registration aware. Pt stated boss stated he will need UDS. Advised pt to let registration know at d/c.

## 2016-08-03 NOTE — Discharge Instructions (Addendum)
Use ice on the sore area 3 or 4 times a day, for several days.  After that use heat as needed to help the discomfort.  Use ibuprofen 600 mg 3 times a day.  Return here, if you feel worse.

## 2016-08-03 NOTE — ED Provider Notes (Signed)
[facility]-EMER[facility]ENCY DEPT Provider Note   CSN: [id number] Arrival date & time: 08/03/16  1609     History   Chief Complaint Chief Complaint  Patient presents with  . Chest Pain    HPI [name] is a 31 y.o. male.  He was at work when he was accidentally struck, the chest, by a hose, under pressure.  This knocked him down.  Currently he has pain with palpation and movement in the left anterior chest.  He denies shortness of breath, headache, neck pain, back pain, weakness or dizziness.  He is able to walk without problem.  There is been no nausea, vomiting or abdominal pain.  There are no other known modifying factors.   HPI  Past Medical History:  Diagnosis Date  . ADD (attention deficit disorder)   . Arthritis   . Smoker     There are no active problems to display for this patient.   History reviewed. No pertinent surgical history.     Home Medications    Prior to Admission medications   Not on File    Family History Family History  Problem Relation Age of Onset  . Hypertension Mother   . Arthritis Maternal Grandmother   . Hypertension Maternal Grandmother   . Arthritis Maternal Grandfather   . Cancer Maternal Grandfather   . Hypertension Paternal Grandmother     Social History Social History  Substance Use Topics  . Smoking status: Former Games developermoker  . Smokeless tobacco: Never Used  . Alcohol use Yes     Comment: occasionally     Allergies   Patient has no known allergies.   Review of Systems Review of Systems  All other systems reviewed and are negative.    Physical Exam Updated Vital Signs BP (!) 150/85 (BP Location: Right Arm)   Pulse (!) 109   Temp 98.2 F (36.8 C) (Oral)   Resp 18   Ht 6\' 4"  (1.93 m)   Wt 136.1 kg (300 lb)   SpO2 96%   BMI 36.52 kg/m   Physical Exam  Constitutional: He is oriented to person, place, and time. He appears well-developed and well-nourished. No distress.  HENT:  Head: Normocephalic and  atraumatic.  Right Ear: External ear normal.  Left Ear: External ear normal.  Eyes: Conjunctivae and EOM are normal. Pupils are equal, round, and reactive to light.  Neck: Normal range of motion and phonation normal. Neck supple.  Cardiovascular: Normal rate, regular rhythm and normal heart sounds.   Pulmonary/Chest: Effort normal and breath sounds normal. He exhibits tenderness (Tender left anterior chest, lower, without crepitation, deformity or skin injury.  No posterior chest wall tenderness or crepitation.). He exhibits no bony tenderness.  Abdominal: Soft. There is no tenderness. There is no guarding. No hernia.  Musculoskeletal: Normal range of motion.  Neurological: He is alert and oriented to person, place, and time. No cranial nerve deficit or sensory deficit. He exhibits normal muscle tone. Coordination normal.  Skin: Skin is warm, dry and intact.  Psychiatric: He has a normal mood and affect. His behavior is normal. Judgment and thought content normal.  Nursing note and vitals reviewed.    ED Treatments / Results  Labs (all labs ordered are listed, but only abnormal results are displayed) Labs Reviewed - No data to display  EKG  EKG Interpretation None       Radiology Dg Chest 2 View  Result Date: 08/03/2016 CLINICAL DATA:  Left anterior rib pain EXAM: CHEST  2  VIEW COMPARISON:  12/17/2015 FINDINGS: No acute infiltrate or effusion. Normal cardiomediastinal silhouette. No pneumothorax. Suspect left ninth rib fracture. IMPRESSION: 1. No pleural effusion or pneumothorax 2. Suspect left ninth rib fracture Electronically Signed   By: Jasmine Pang M.D.   On: 08/03/2016 17:09   Dg Ribs Unilateral Left  Result Date: 08/03/2016 CLINICAL DATA:  Patient states that he had an injury to his anterior left chest as indicated by the metal bb. He has pain and bruising in that area. Hx of smoking. EXAM: LEFT RIBS - 2 VIEW COMPARISON:  08/03/2016 FINDINGS: No fracture or other bone lesions  are seen involving the ribs. No pneumothorax. IMPRESSION: Negative. Electronically Signed   By: Corlis Leak M.D.   On: 08/03/2016 18:29    Procedures Procedures (including critical care time)  Medications Ordered in ED Medications  oxyCODONE-acetaminophen (PERCOCET/ROXICET) 5-325 MG per tablet 1 tablet (1 tablet Oral Given 08/03/16 1740)     Initial Impression / Assessment and Plan / ED Course  I have reviewed the triage vital signs and the nursing notes.  Pertinent labs & imaging results that were available during my care of the patient were reviewed by me and considered in my medical decision making (see chart for details).      Patient Vitals for the past 24 hrs:  BP Temp Temp src Pulse Resp SpO2 Height Weight  08/03/16 1631 (!) 150/85 98.2 F (36.8 C) Oral (!) 109 18 96 % 6\' 4"  (1.93 m) 136.1 kg (300 lb)    6:57 PM Reevaluation with update and discussion. After initial assessment and treatment, an updated evaluation reveals no change in clinical status.  Findings discussed with patient and wife, all questions answered. Leyda Vanderwerf L    Final Clinical Impressions(s) / ED Diagnoses   Final diagnoses:  Pain  Contusion of left chest wall, initial encounter   Chest wall contusion, without fracture, pneumothorax, or expected visceral injury.  Nursing Notes Reviewed/ Care Coordinated Applicable Imaging Reviewed Interpretation of Laboratory Data incorporated into ED treatment  The patient appears reasonably screened and/or stabilized for discharge and I doubt any other medical condition or other [facility] requiring further screening, evaluation, or treatment in the ED at this time prior to discharge.  Plan: Home Medications-ibuprofen for pain; Home Treatments-cryotherapy advance to heat therapy, light duty for 4 days; return here if the recommended treatment, does not improve the symptoms; Recommended follow up-PCP or return here as needed for problems   New Prescriptions There  are no discharge medications for this patient.    Mancel Bale, MD 08/03/16 [phone]

## 2016-08-03 NOTE — ED Notes (Signed)
Pt returned from xray

## 2016-09-05 ENCOUNTER — Ambulatory Visit: Payer: Self-pay

## 2016-09-05 ENCOUNTER — Other Ambulatory Visit: Payer: Self-pay | Admitting: Occupational Medicine

## 2016-09-05 DIAGNOSIS — R0781 Pleurodynia: Secondary | ICD-10-CM

## 2016-09-21 ENCOUNTER — Telehealth: Payer: Self-pay | Admitting: Family Medicine

## 2016-09-21 ENCOUNTER — Encounter: Payer: Self-pay | Admitting: Family Medicine

## 2016-09-21 ENCOUNTER — Ambulatory Visit (INDEPENDENT_AMBULATORY_CARE_PROVIDER_SITE_OTHER): Payer: 59 | Admitting: Family Medicine

## 2016-09-21 DIAGNOSIS — J309 Allergic rhinitis, unspecified: Secondary | ICD-10-CM | POA: Insufficient documentation

## 2016-09-21 DIAGNOSIS — G5603 Carpal tunnel syndrome, bilateral upper limbs: Secondary | ICD-10-CM | POA: Diagnosis not present

## 2016-09-21 DIAGNOSIS — H9203 Otalgia, bilateral: Secondary | ICD-10-CM

## 2016-09-21 DIAGNOSIS — Z87891 Personal history of nicotine dependence: Secondary | ICD-10-CM | POA: Diagnosis not present

## 2016-09-21 NOTE — Telephone Encounter (Signed)
Records requested from [id number] 09/21/16 

## 2016-09-21 NOTE — Patient Instructions (Addendum)
Use a night splints and see how you do with that if they were keep using them for several months. Use Flonase and Zyrtec regularly for the next several weeks and if it does not take the ear pain and wane call me Carpal Tunnel Syndrome Carpal tunnel syndrome is a condition that causes pain in your hand and arm. The carpal tunnel is a narrow area that is on the palm side of your wrist. Repeated wrist motion or certain diseases may cause swelling in the tunnel. This swelling can pinch the main nerve in the wrist (median nerve). Follow these instructions at home: If you have a splint:  Wear it as told by your doctor. Remove it only as told by your doctor.  Loosen the splint if your fingers: ? Become numb and tingle. ? Turn blue and cold.  Keep the splint clean and dry. General instructions  Take over-the-counter and prescription medicines only as told by your doctor.  Rest your wrist from any activity that may be causing your pain. If needed, talk to your employer about changes that can be made in your work, such as getting a wrist pad to use while typing.  If directed, apply ice to the painful area: ? Put ice in a plastic bag. ? Place a towel between your skin and the bag. ? Leave the ice on for 20 minutes, 2-3 times per day.  Keep all follow-up visits as told by your doctor. This is important.  Do any exercises as told by your doctor, physical therapist, or occupational therapist. Contact a doctor if:  You have new symptoms.  Medicine does not help your pain.  Your symptoms get worse. This information is not intended to replace advice given to you by your health care provider. Make sure you discuss any questions you have with your health care provider. Document Released: 02/03/2011 Document Revised: 07/23/2015 Document Reviewed: 07/02/2014 Elsevier Interactive Patient Education  [employer]2018 Elsevier Inc.

## 2016-09-21 NOTE — Progress Notes (Signed)
   Subjective:    Patient ID: [name], male    DOB: 10/28/1985, 31 y.o.   MRN: [id number]  HPI He is here for evaluation of several issues. He complains of a one month history of bilateral ear pain, left greater than right that is made worse with high frequency noises. He also has underlying allergies with nasal congestion, sneezing, itchy watery eyes and rhinorrhea however he rarely treats these. He's had no fever, chills, cough or congestion. He has been using Zyrtec occasionally. He is a former smoker. [name] complains of a nine-month history of difficulty with bilateral hand pain that he notes mainly at night. This will wake him up. He states that it is entire hand goes to sleep. He changes position of his arms, the symptoms go away very quickly. He was apparently seen by someone else and x-rays of his neck were done but he does not have the results. He has difficulty during the day sometimes when he drives his vehicle notes decreased sensation in the median nerve distribution on the right.  Review of Systems     Objective:   Physical Exam Alert and in no distress. Nasal mucosa is normal. Tympanic membranes and canals are normal. Pharyngeal area is normal. Neck is supple without adenopathy or thyromegaly. Cardiac exam shows a regular sinus rhythm without murmurs or gallops. Lungs are clear to auscultation. Normal strength and sensation of the hands. Tinel's was negative however Phalen's test was positive. The tingling sensation went away very quickly after his hands were placed in the neutral position.       Assessment & Plan:  Bilateral carpal tunnel syndrome  Allergic rhinitis, unspecified seasonality, unspecified trigger  Otalgia of both ears I discussed the treatment of CTS with him. Recommend he use night splints. Also discussed other options if this is not successful including injections and possible surgery. I will treat his allergies more aggressively with Flonase and  Zyrtec regularly and see if this helps with his ear pain. If no improvement, I will refer to ENT.

## 2016-09-30 ENCOUNTER — Telehealth: Payer: Self-pay

## 2016-09-30 NOTE — Telephone Encounter (Signed)
Records placed in your folder for review from [facility]. Trixie Rude/RLB

## 2016-11-29 ENCOUNTER — Ambulatory Visit (INDEPENDENT_AMBULATORY_CARE_PROVIDER_SITE_OTHER): Payer: 59 | Admitting: Family Medicine

## 2016-11-29 ENCOUNTER — Encounter: Payer: Self-pay | Admitting: Family Medicine

## 2016-11-29 VITALS — BP 130/80 | HR 75 | Temp 98.0°F | Wt 290.0 lb

## 2016-11-29 DIAGNOSIS — M545 Low back pain: Secondary | ICD-10-CM | POA: Diagnosis not present

## 2016-11-29 DIAGNOSIS — G5621 Lesion of ulnar nerve, right upper limb: Secondary | ICD-10-CM | POA: Diagnosis not present

## 2016-11-29 DIAGNOSIS — J029 Acute pharyngitis, unspecified: Secondary | ICD-10-CM

## 2016-11-29 DIAGNOSIS — G8929 Other chronic pain: Secondary | ICD-10-CM | POA: Diagnosis not present

## 2016-11-29 LAB — POCT RAPID STREP A (OFFICE): Rapid Strep A Screen: NEGATIVE

## 2016-11-29 NOTE — Progress Notes (Signed)
   Subjective:    Patient ID: [name], male    DOB: 09/14/1985, 31 y.o.   MRN: [id number]  HPI He complains of a four-day history of left-sided sore throat, fatigue, slightly productive cough but no fever, chills, earache. He also notes that when he rests on his right elbow, he will get numbness and tingling in the low fourth and fifth fingers in restraints his elbow goes away. He also has a several year history of intermittent low back pain and states that he has had an x-ray of this in the past which was negative. He has not ever been sent to physical therapy. It doesn't bother him more when he is physically active using his back.   Review of Systems     Objective:   Physical Exam Alert and in no distress. Tympanic membranes and canals are normal. Pharyngeal area is normal. Neck is supple without adenopathy or thyromegaly. Cardiac exam shows a regular sinus rhythm without murmurs or gallops. Lungs are clear to auscultation. Strep screen is negative. Tinel's over the ulnar groove does cause symptoms      Assessment & Plan:  Pharyngitis, unspecified etiology - Plan: Rapid Strep A  Ulnar neuropathy of right upper extremity  Chronic low back pain, unspecified back pain laterality, with sciatica presence unspecified Recommend supportive care for the pharyngitis. Discussed the neuropathy with him and essentially recommend he keep his elbow extended beyond 90 to help reduce the pressure over the ulnar notch. Discussed back care with him and will refer to physical therapy for a good back rehabilitation program.

## 2016-11-30 ENCOUNTER — Ambulatory Visit: Payer: 59 | Admitting: Family Medicine

## 2016-12-06 ENCOUNTER — Ambulatory Visit: Payer: 59 | Admitting: Family Medicine

## 2016-12-13 ENCOUNTER — Ambulatory Visit: Payer: 59 | Attending: Family Medicine

## 2018-04-04 ENCOUNTER — Telehealth: Payer: Self-pay | Admitting: Family Medicine

## 2018-04-04 NOTE — Telephone Encounter (Signed)
Dismissal letter in [employer]  °

## 2019-08-28 ENCOUNTER — Other Ambulatory Visit: Payer: Self-pay

## 2019-08-28 ENCOUNTER — Emergency Department (HOSPITAL_COMMUNITY): Payer: Self-pay

## 2019-08-28 ENCOUNTER — Inpatient Hospital Stay (HOSPITAL_COMMUNITY)
Admission: EM | Admit: 2019-08-28 | Discharge: 2019-08-29 | DRG: 872 | Disposition: A | Discharge status: Home / Self Care | Payer: Self-pay | Attending: Internal Medicine | Admitting: Internal Medicine

## 2019-08-28 ENCOUNTER — Encounter (HOSPITAL_COMMUNITY): Payer: Self-pay

## 2019-08-28 ENCOUNTER — Inpatient Hospital Stay (HOSPITAL_COMMUNITY): Payer: Self-pay

## 2019-08-28 DIAGNOSIS — Z87891 Personal history of nicotine dependence: Secondary | ICD-10-CM

## 2019-08-28 DIAGNOSIS — Z7289 Other problems related to lifestyle: Secondary | ICD-10-CM

## 2019-08-28 DIAGNOSIS — A419 Sepsis, unspecified organism: Secondary | ICD-10-CM | POA: Diagnosis present

## 2019-08-28 DIAGNOSIS — A4189 Other specified sepsis: Principal | ICD-10-CM | POA: Diagnosis present

## 2019-08-28 DIAGNOSIS — K4020 Bilateral inguinal hernia, without obstruction or gangrene, not specified as recurrent: Secondary | ICD-10-CM | POA: Diagnosis present

## 2019-08-28 DIAGNOSIS — A084 Viral intestinal infection, unspecified: Secondary | ICD-10-CM | POA: Diagnosis present

## 2019-08-28 DIAGNOSIS — F909 Attention-deficit hyperactivity disorder, unspecified type: Secondary | ICD-10-CM | POA: Diagnosis present

## 2019-08-28 DIAGNOSIS — R109 Unspecified abdominal pain: Secondary | ICD-10-CM

## 2019-08-28 DIAGNOSIS — R197 Diarrhea, unspecified: Secondary | ICD-10-CM

## 2019-08-28 DIAGNOSIS — R509 Fever, unspecified: Secondary | ICD-10-CM

## 2019-08-28 DIAGNOSIS — Z20822 Contact with and (suspected) exposure to covid-19: Secondary | ICD-10-CM | POA: Diagnosis present

## 2019-08-28 DIAGNOSIS — J309 Allergic rhinitis, unspecified: Secondary | ICD-10-CM | POA: Diagnosis present

## 2019-08-28 DIAGNOSIS — I959 Hypotension, unspecified: Secondary | ICD-10-CM

## 2019-08-28 DIAGNOSIS — K219 Gastro-esophageal reflux disease without esophagitis: Secondary | ICD-10-CM | POA: Diagnosis present

## 2019-08-28 DIAGNOSIS — R9389 Abnormal findings on diagnostic imaging of other specified body structures: Secondary | ICD-10-CM

## 2019-08-28 LAB — RESPIRATORY PANEL BY PCR

## 2019-08-28 LAB — URINALYSIS, ROUTINE W REFLEX MICROSCOPIC
Bacteria, UA: NONE SEEN
Bilirubin Urine: NEGATIVE
Glucose, UA: NEGATIVE mg/dL
Hgb urine dipstick: NEGATIVE
Ketones, ur: NEGATIVE mg/dL
Leukocytes,Ua: NEGATIVE
Nitrite: NEGATIVE
Protein, ur: 30 mg/dL — AB
Specific Gravity, Urine: 1.03 (ref 1.005–1.030)
pH: 6 (ref 5.0–8.0)

## 2019-08-28 LAB — CBC WITH DIFFERENTIAL/PLATELET
Abs Immature Granulocytes: 0.03 10*3/uL (ref 0.00–0.07)
Basophils Absolute: 0 10*3/uL (ref 0.0–0.1)
Basophils Relative: 0 %
Eosinophils Absolute: 0 10*3/uL (ref 0.0–0.5)
Eosinophils Relative: 0 %
HCT: 38.5 % — ABNORMAL LOW (ref 39.0–52.0)
Hemoglobin: 13.8 g/dL (ref 13.0–17.0)
Immature Granulocytes: 0 %
Lymphocytes Relative: 10 %
Lymphs Abs: 1 10*3/uL (ref 0.7–4.0)
MCH: 32.9 pg (ref 26.0–34.0)
MCHC: 35.8 g/dL (ref 30.0–36.0)
MCV: 91.9 fL (ref 80.0–100.0)
Monocytes Absolute: 1 10*3/uL (ref 0.1–1.0)
Monocytes Relative: 10 %
Neutro Abs: 7.8 10*3/uL — ABNORMAL HIGH (ref 1.7–7.7)
Neutrophils Relative %: 80 %
Platelets: 154 10*3/uL (ref 150–400)
RBC: 4.19 MIL/uL — ABNORMAL LOW (ref 4.22–5.81)
RDW: 11.8 % (ref 11.5–15.5)
WBC: 9.9 10*3/uL (ref 4.0–10.5)
nRBC: 0 % (ref 0.0–0.2)

## 2019-08-28 LAB — COMPREHENSIVE METABOLIC PANEL
ALT: 20 U/L (ref 0–44)
AST: 19 U/L (ref 15–41)
Albumin: 4.5 g/dL (ref 3.5–5.0)
Alkaline Phosphatase: 72 U/L (ref 38–126)
Anion gap: 11 (ref 5–15)
BUN: 12 mg/dL (ref 6–20)
CO2: 24 mmol/L (ref 22–32)
Calcium: 8.8 mg/dL — ABNORMAL LOW (ref 8.9–10.3)
Chloride: 102 mmol/L (ref 98–111)
Creatinine, Ser: 1.03 mg/dL (ref 0.61–1.24)
GFR calc Af Amer: >60 mL/min (ref >60)
GFR calc non Af Amer: >60 mL/min (ref >60)
Glucose, Bld: 136 mg/dL — ABNORMAL HIGH (ref 70–99)
Potassium: 3.4 mmol/L — ABNORMAL LOW (ref 3.5–5.1)
Sodium: 137 mmol/L (ref 135–145)
Total Bilirubin: 1 mg/dL (ref 0.3–1.2)
Total Protein: 7.7 g/dL (ref 6.5–8.1)

## 2019-08-28 LAB — APTT: aPTT: 28 seconds (ref 24–36)

## 2019-08-28 LAB — LACTIC ACID, PLASMA
Lactic Acid, Venous: 1.5 mmol/L (ref 0.5–1.9)
Lactic Acid, Venous: 1.1 mmol/L (ref 0.5–1.9)

## 2019-08-28 LAB — PROTIME-INR
INR: 1.1 (ref 0.8–1.2)
Prothrombin Time: 13.4 seconds (ref 11.4–15.2)

## 2019-08-28 LAB — LIPASE, BLOOD: Lipase: 21 U/L (ref 11–51)

## 2019-08-28 LAB — MAGNESIUM: Magnesium: 1.9 mg/dL (ref 1.7–2.4)

## 2019-08-28 LAB — SARS CORONAVIRUS 2 BY RT PCR (HOSPITAL ORDER, PERFORMED IN ~~LOC~~ HOSPITAL LAB): SARS Coronavirus 2: NEGATIVE

## 2019-08-28 MED ORDER — ONDANSETRON HCL 4 MG/2ML IJ SOLN
4.0000 mg | Freq: Once | INTRAMUSCULAR | Status: AC
  Administered 2019-08-28: 4 mg via INTRAVENOUS
  Filled 2019-08-28: qty 2

## 2019-08-28 MED ORDER — ONDANSETRON HCL 4 MG PO TABS: 4.0000 mg | ORAL_TABLET | Freq: Four times a day (QID) | ORAL | Status: DC | PRN

## 2019-08-28 MED ORDER — SODIUM CHLORIDE 0.9 % IV SOLN
2.0000 g | Freq: Once | INTRAVENOUS | Status: AC
  Administered 2019-08-28: 2 g via INTRAVENOUS
  Filled 2019-08-28: qty 20

## 2019-08-28 MED ORDER — SODIUM CHLORIDE 0.9 % IV SOLN: 2.0000 g | INTRAVENOUS | Status: DC

## 2019-08-28 MED ORDER — METRONIDAZOLE IN NACL 5-0.79 MG/ML-% IV SOLN
500.0000 mg | Freq: Three times a day (TID) | INTRAVENOUS | Status: DC
  Administered 2019-08-28 – 2019-08-29 (×3): 500 mg via INTRAVENOUS
  Filled 2019-08-28 (×3): qty 100

## 2019-08-28 MED ORDER — LACTATED RINGERS IV BOLUS (SEPSIS)
1000.0000 mL | Freq: Once | INTRAVENOUS | Status: AC
  Administered 2019-08-28: 1000 mL via INTRAVENOUS

## 2019-08-28 MED ORDER — SODIUM CHLORIDE 0.9 % IV BOLUS
1000.0000 mL | Freq: Once | INTRAVENOUS | Status: AC
  Administered 2019-08-28: 1000 mL via INTRAVENOUS

## 2019-08-28 MED ORDER — KETOROLAC TROMETHAMINE 15 MG/ML IJ SOLN: 15.0000 mg | Freq: Once | INTRAMUSCULAR | Status: DC

## 2019-08-28 MED ORDER — ACETAMINOPHEN 500 MG PO TABS
1000.0000 mg | ORAL_TABLET | Freq: Once | ORAL | Status: AC
  Administered 2019-08-28: 1000 mg via ORAL
  Filled 2019-08-28: qty 2

## 2019-08-28 MED ORDER — ONDANSETRON HCL 4 MG/2ML IJ SOLN
4.0000 mg | Freq: Four times a day (QID) | INTRAMUSCULAR | Status: DC | PRN
  Administered 2019-08-28 – 2019-08-29 (×2): 4 mg via INTRAVENOUS
  Filled 2019-08-28 (×2): qty 2

## 2019-08-28 MED ORDER — IOHEXOL 300 MG/ML  SOLN
100.0000 mL | Freq: Once | INTRAMUSCULAR | Status: AC | PRN
  Administered 2019-08-28: 100 mL via INTRAVENOUS

## 2019-08-28 MED ORDER — ACETAMINOPHEN 650 MG RE SUPP: 650.0000 mg | Freq: Four times a day (QID) | RECTAL | Status: DC | PRN

## 2019-08-28 MED ORDER — METRONIDAZOLE IN NACL 5-0.79 MG/ML-% IV SOLN
500.0000 mg | Freq: Once | INTRAVENOUS | Status: AC
  Administered 2019-08-28: 500 mg via INTRAVENOUS
  Filled 2019-08-28: qty 100

## 2019-08-28 MED ORDER — LACTATED RINGERS IV SOLN: INTRAVENOUS | Status: DC

## 2019-08-28 MED ORDER — OXYCODONE HCL 5 MG PO TABS
5.0000 mg | ORAL_TABLET | ORAL | Status: DC | PRN
  Administered 2019-08-28: 5 mg via ORAL
  Filled 2019-08-28: qty 1

## 2019-08-28 MED ORDER — PANTOPRAZOLE SODIUM 40 MG IV SOLR
40.0000 mg | Freq: Two times a day (BID) | INTRAVENOUS | Status: DC
  Administered 2019-08-28: 40 mg via INTRAVENOUS
  Filled 2019-08-28 (×2): qty 40

## 2019-08-28 MED ORDER — SODIUM CHLORIDE 0.9 % IV SOLN
500.0000 mg | INTRAVENOUS | Status: DC
  Administered 2019-08-28: 500 mg via INTRAVENOUS
  Filled 2019-08-28: qty 500

## 2019-08-28 MED ORDER — MORPHINE SULFATE (PF) 2 MG/ML IV SOLN
2.0000 mg | INTRAVENOUS | Status: DC | PRN
  Administered 2019-08-28 – 2019-08-29 (×2): 2 mg via INTRAVENOUS
  Filled 2019-08-28 (×2): qty 1

## 2019-08-28 MED ORDER — FAMOTIDINE IN NACL 20-0.9 MG/50ML-% IV SOLN
20.0000 mg | Freq: Once | INTRAVENOUS | Status: AC
  Administered 2019-08-28: 20 mg via INTRAVENOUS
  Filled 2019-08-28: qty 50

## 2019-08-28 MED ORDER — LACTATED RINGERS IV BOLUS (SEPSIS)
800.0000 mL | Freq: Once | INTRAVENOUS | Status: AC
  Administered 2019-08-28: 800 mL via INTRAVENOUS

## 2019-08-28 MED ORDER — ACETAMINOPHEN 325 MG PO TABS
650.0000 mg | ORAL_TABLET | Freq: Four times a day (QID) | ORAL | Status: DC | PRN
  Administered 2019-08-28: 650 mg via ORAL
  Filled 2019-08-28: qty 2

## 2019-08-28 NOTE — Discharge Instructions (Signed)
Use Zofran as needed for nausea and vomiting. Follow-up with your primary care doctor as needed. Return for signs of significant dehydration, confusion, blood in the stools, localizing or severe abdominal pain.

## 2019-08-28 NOTE — H&P (Signed)
History and Physical    KRISS ISHLER FXJ:[id number] DOB: 06-Jun-1985 DOA: 08/28/2019  PCP: [facility]  Patient coming from: home  I have personally briefly reviewed patient's old medical records in [facility]  Chief Complaint: nausea, vomiting, abdominal pain  HPI: CYRIL WOODMANSEE is Tom Ragsdale 34 y.o. male with medical history significant of ADD, arthritis, GERD who presents with nausea/vomiting/diarrhea and abdominal pain.  Pt notes his sx started several hours after eating sushi yesterday afternoon.  He notes about 2 hours later he started to notice N/V/D and abdominal discomfort.  He ended up going to work, but had progressive symptoms with abdominal cramping, 3-4 episodes of emesis yesterday.  Describes emesis as black and occasionally bloody.  Stool is described similarly in color.  He notes fever.  He notes SOB, no chest pain.  No one else around him is sick.  He did not get covid vaccine.  No etoh, no smoking, no other drugs.     ED Course: Labs, IVF, abx, CXR.  Admit for sepsis/gastroenteritis.    Review of Systems: As per HPI otherwise all other systems reviewed and are negative.   Past Medical History:  Diagnosis Date  . ADD (attention deficit disorder)   . Arthritis   . Smoker     History reviewed. No pertinent surgical history.  Social History  reports that he has quit smoking. He has never used smokeless tobacco. He reports current alcohol use. He reports that he does not use drugs.  No Known Allergies  Family History  Problem Relation Age of Onset  . Hypertension Mother   . Arthritis Maternal Grandmother   . Hypertension Maternal Grandmother   . Arthritis Maternal Grandfather   . Cancer Maternal Grandfather   . Hypertension Paternal Grandmother     Prior to Admission medications   Medication Sig Start Date End Date Taking? Authorizing Provider  calcium carbonate (TUMS - DOSED IN MG ELEMENTAL CALCIUM) 500 MG chewable tablet Chew 1  tablet by mouth daily.   Yes [provider]    Physical Exam: Vitals:   08/28/19 1019 08/28/19 1134 08/28/19 1334 08/28/19 1545  BP:  (!) 107/59 123/66 126/60  Pulse:  97 94 (!) 119  Resp:   16   Temp:  99.3 F (37.4 C) 98.6 F (37 C)   TempSrc:  Oral Oral   SpO2:  98% 99% 92%  Weight: 136.1 kg     Height: 6\' 4"  (1.93 m)       Constitutional: NAD, calm, comfortable Vitals:   08/28/19 1019 08/28/19 1134 08/28/19 1334 08/28/19 1545  BP:  (!) 107/59 123/66 126/60  Pulse:  97 94 (!) 119  Resp:   16   Temp:  99.3 F (37.4 C) 98.6 F (37 C)   TempSrc:  Oral Oral   SpO2:  98% 99% 92%  Weight: 136.1 kg     Height: 6\' 4"  (1.93 m)      Eyes: PERRL, lids and conjunctivae normal ENMT: Mucous membranes are moist. Posterior pharynx clear of any exudate or lesions.Normal dentition.  Neck: normal, supple, no masses, no thyromegaly Respiratory: clear to auscultation bilaterally, no wheezing, no crackles. Normal respiratory effort. No accessory muscle use.  Cardiovascular: Regular rate and rhythm, no murmurs / rubs / gallops. No extremity edema.  Abdomen: diffuse abdominal TTP, no rebound/guarding, nondistended Musculoskeletal: no clubbing / cyanosis. No joint deformity upper and lower extremities. Good ROM, no contractures. Normal muscle tone.  Skin: no rashes, lesions, ulcers. No  induration Neurologic: CN 2-12 grossly intact. Sensation intact. Strength 5/5 in all 4.  Psychiatric: Normal judgment and insight. Alert and oriented x 3. Normal mood.  Labs on Admission: I have personally reviewed following labs and imaging studies  CBC: Recent Labs  Lab 08/28/19 0750  WBC 9.9  NEUTROABS 7.8*  HGB 13.8  HCT 38.5*  MCV 91.9  PLT 154    Basic Metabolic Panel: Recent Labs  Lab 08/28/19 0750  NA 137  K 3.4*  CL 102  CO2 24  GLUCOSE 136*  BUN 12  CREATININE 1.03  CALCIUM 8.8*  MG 1.9    GFR: Estimated Creatinine Clearance: 152.2 mL/min (by C-G formula based on  SCr of 1.03 mg/dL).  Liver Function Tests: Recent Labs  Lab 08/28/19 0750  AST 19  ALT 20  ALKPHOS 72  BILITOT 1.0  PROT 7.7  ALBUMIN 4.5    Urine analysis:    Component Value Date/Time   COLORURINE YELLOW 08/28/2019 1053   APPEARANCEUR CLEAR 08/28/2019 1053   LABSPEC 1.030 08/28/2019 1053   PHURINE 6.0 08/28/2019 1053   GLUCOSEU NEGATIVE 08/28/2019 1053   HGBUR NEGATIVE 08/28/2019 1053   BILIRUBINUR NEGATIVE 08/28/2019 1053   KETONESUR NEGATIVE 08/28/2019 1053   PROTEINUR 30 (Pearlina Friedly) 08/28/2019 1053   NITRITE NEGATIVE 08/28/2019 1053   LEUKOCYTESUR NEGATIVE 08/28/2019 1053    Radiological Exams on Admission: DG Chest 2 View  Result Date: 08/28/2019 CLINICAL DATA:  Fever EXAM: CHEST - 2 VIEW COMPARISON:  09/05/2016 FINDINGS: The heart size and mediastinal contours are within normal limits. Diffuse interstitial opacity. The visualized skeletal structures are unremarkable. IMPRESSION: Diffuse interstitial opacity, consistent with edema or atypical/viral infection. No focal airspace opacity. Electronically Signed   By: Lauralyn Primes M.D.   On: 08/28/2019 12:22   CT CHEST W CONTRAST  Result Date: 08/28/2019 CLINICAL DATA:  Pain.  Shortness of breath. EXAM: CT CHEST, ABDOMEN, AND PELVIS WITH CONTRAST TECHNIQUE: Multidetector CT imaging of the chest, abdomen and pelvis was performed following the standard protocol during bolus administration of intravenous contrast. CONTRAST:  OMNIPAQUE IOHEXOL 300 MG/ML  SOLN COMPARISON:  None. FINDINGS: CT CHEST FINDINGS Cardiovascular: The heart size is mildly enlarged. There is no significant pericardial effusion. There is no large centrally located pulmonary embolism. The thoracic aorta is unremarkable. Mediastinum/Nodes: -- No mediastinal lymphadenopathy. -- No hilar lymphadenopathy. -- No axillary lymphadenopathy. -- No supraclavicular lymphadenopathy. -- Normal thyroid gland where visualized. -  Unremarkable esophagus. Lungs/Pleura: There is  atelectasis at the lung bases. There is no significant pleural effusion. No large focal infiltrate. No pneumothorax. The trachea is unremarkable. Musculoskeletal: No chest wall abnormality. No bony spinal canal stenosis. CT ABDOMEN PELVIS FINDINGS Hepatobiliary: There is decreased hepatic attenuation suggestive of hepatic steatosis. Normal gallbladder.There is no biliary ductal dilation. Pancreas: Normal contours without ductal dilatation. No peripancreatic fluid collection. Spleen: Unremarkable. Adrenals/Urinary Tract: --Adrenal glands: Unremarkable. --Right kidney/ureter: No hydronephrosis or radiopaque kidney stones. --Left kidney/ureter: No hydronephrosis or radiopaque kidney stones. --Urinary bladder: Unremarkable. Stomach/Bowel: --Stomach/Duodenum: No hiatal hernia or other gastric abnormality. Normal duodenal course and caliber. --Small bowel: Unremarkable. --Colon: There is apparent wall thickening and fat stranding about the ascending colon in cecum to the level of the hepatic flexure. There is liquid stool at this level. --Appendix: Normal. Vascular/Lymphatic: Normal course and caliber of the major abdominal vessels. --No retroperitoneal lymphadenopathy. --No mesenteric lymphadenopathy. --No pelvic or inguinal lymphadenopathy. Reproductive: Unremarkable Other: No ascites or free air. There are small bilateral fat containing inguinal hernias. There is  Jakiah Goree small fat containing umbilical hernia. Musculoskeletal. No acute displaced fractures. IMPRESSION: 1. Apparent wall thickening and fat stranding about the ascending colon in cecum to the level of the hepatic flexure, concerning for infectious or inflammatory colitis. 2. Hepatic steatosis. 3. Mild cardiomegaly. Electronically Signed   By: Katherine Mantle M.D.   On: 08/28/2019 17:33   CT ABDOMEN PELVIS W CONTRAST  Result Date: 08/28/2019 CLINICAL DATA:  Pain.  Shortness of breath. EXAM: CT CHEST, ABDOMEN, AND PELVIS WITH CONTRAST TECHNIQUE: Multidetector  CT imaging of the chest, abdomen and pelvis was performed following the standard protocol during bolus administration of intravenous contrast. CONTRAST:  OMNIPAQUE IOHEXOL 300 MG/ML  SOLN COMPARISON:  None. FINDINGS: CT CHEST FINDINGS Cardiovascular: The heart size is mildly enlarged. There is no significant pericardial effusion. There is no large centrally located pulmonary embolism. The thoracic aorta is unremarkable. Mediastinum/Nodes: -- No mediastinal lymphadenopathy. -- No hilar lymphadenopathy. -- No axillary lymphadenopathy. -- No supraclavicular lymphadenopathy. -- Normal thyroid gland where visualized. -  Unremarkable esophagus. Lungs/Pleura: There is atelectasis at the lung bases. There is no significant pleural effusion. No large focal infiltrate. No pneumothorax. The trachea is unremarkable. Musculoskeletal: No chest wall abnormality. No bony spinal canal stenosis. CT ABDOMEN PELVIS FINDINGS Hepatobiliary: There is decreased hepatic attenuation suggestive of hepatic steatosis. Normal gallbladder.There is no biliary ductal dilation. Pancreas: Normal contours without ductal dilatation. No peripancreatic fluid collection. Spleen: Unremarkable. Adrenals/Urinary Tract: --Adrenal glands: Unremarkable. --Right kidney/ureter: No hydronephrosis or radiopaque kidney stones. --Left kidney/ureter: No hydronephrosis or radiopaque kidney stones. --Urinary bladder: Unremarkable. Stomach/Bowel: --Stomach/Duodenum: No hiatal hernia or other gastric abnormality. Normal duodenal course and caliber. --Small bowel: Unremarkable. --Colon: There is apparent wall thickening and fat stranding about the ascending colon in cecum to the level of the hepatic flexure. There is liquid stool at this level. --Appendix: Normal. Vascular/Lymphatic: Normal course and caliber of the major abdominal vessels. --No retroperitoneal lymphadenopathy. --No mesenteric lymphadenopathy. --No pelvic or inguinal lymphadenopathy. Reproductive:  Unremarkable Other: No ascites or free air. There are small bilateral fat containing inguinal hernias. There is Tarryn Bogdan small fat containing umbilical hernia. Musculoskeletal. No acute displaced fractures. IMPRESSION: 1. Apparent wall thickening and fat stranding about the ascending colon in cecum to the level of the hepatic flexure, concerning for infectious or inflammatory colitis. 2. Hepatic steatosis. 3. Mild cardiomegaly. Electronically Signed   By: Katherine Mantle M.D.   On: 08/28/2019 17:33    EKG: Independently reviewed. Sinus tachy, RAD  Assessment/Plan Active Problems:   Sepsis (HCC)  Sepsis 2/2 Colitis  Gastroenteritis: suspect infectious with presentation which began hours after eating sushi which he thought tasted bad.  No other sick contacts, no one else ate what he did.  Fever, tachycardia.  Hypotension.  Meets criteria for sepsis at presentation. CXR concerning for diffuse interstitial opacity concerning for edema vs atypical/viral infection -> initially concerning for atypical/viral pneumonia, but CT chest without noted infiltrate CT chest with atelectasis at bases, no noted infiltrate CT abdomen pelvis with findings concerning for infectious vs inflammatory colitis Continue abx with ceftriaxone/azithromycin Follow c diff/GI path panel  Continue IVF, antiemetics, pain meds Negative covid, pending RVP  Nausea  Vomiting  Concerning for Hematemesis and melena?: he notes dark/black emesis which is occasionally blood.  He reports stool is black as well. No recurrent vomiting reported in ED Will start on PPI Hemoccult  GI c/s   Shortness of Breath: CT without significant findings.  Not Annielee Jemmott PE study, but no large centrally located PE noted.  Suspect related to abdominal sx and discomfort/splinting. Continue to monitor with treatment, w/u further as indicated.  Currently satting well on RA.  GERD: used to take ranitidine, now takes tums - on PPI as noted above  DVT prophylaxis: SCD   Code Status:   full Family Communication:  None at bedside  Disposition Plan:   Patient is from:  home  Anticipated DC to:  home  Anticipated DC date:  7/2  Anticipated DC barriers: Improvement in nausea/vomiting/diarrhea and abdominal pain - improvement in ability to take PO  Consults called:  GI Admission status:  inpatient  Severity of Illness: The appropriate patient status for this patient is INPATIENT. Inpatient status is judged to be reasonable and necessary in order to provide the required intensity of service to ensure the patient's safety. The patient's presenting symptoms, physical exam findings, and initial radiographic and laboratory data in the context of their chronic comorbidities is felt to place them at high risk for further clinical deterioration. Furthermore, it is not anticipated that the patient will be medically stable for discharge from the hospital within 2 midnights of admission. The following factors support the patient status of inpatient.   " The patient's presenting symptoms include nausea, vomiting, diarrhea, abdominal pain. " The worrisome physical exam findings include hypotension, fever, abdominal pain, tachycardia. " The initial radiographic and laboratory data are worrisome because of colitis on imaging.    * I certify that at the point of admission it is my clinical judgment that the patient will require inpatient hospital care spanning beyond 2 midnights from the point of admission due to high intensity of service, high risk for further deterioration and high frequency of surveillance required.Lacretia Nicks*     Caldwell Powell MD [facility]  How to contact the [facility] Attending or Consulting provider 7A - 7P or covering provider during after hours 7P -7A, for this patient?   1. Check the care team in [facility] and look for Maley Venezia) attending/consulting [facility] provider listed and b) the [facility] team listed 2. Log into [email or web address] and use [place]'s universal password to  access. If you do not have the password, please contact the hospital operator. 3. Locate the [facility] provider you are looking for under [facility] and page to Erionna Strum number that you can be directly reached. 4. If you still have difficulty reaching the provider, please page the [facility] (Director on Call) for the Hospitalists listed on amion for assistance.  08/28/2019, 6:01 PM

## 2019-08-28 NOTE — ED Triage Notes (Signed)
Pt reports abd pain, v/d after eating sushi last night.  Pt says feels like he has to lay down.

## 2019-08-28 NOTE — ED Provider Notes (Signed)
[facility] EMERGENCY DEPARTMENT Provider Note   CSN: [id number] Arrival date & time: 08/28/19  2409     History Chief Complaint  Patient presents with  . Abdominal Pain    [name] is a 34 y.o. male.  Patient with cigarette smoking history presents with abdominal cramping vomiting nausea and diarrhea since eating sushi yesterday evening.  No sick contacts known.  Cramping sensation nonlocalized.  No surgical history.        Past Medical History:  Diagnosis Date  . ADD (attention deficit disorder)   . Arthritis   . Smoker     Patient Active Problem List   Diagnosis Date Noted  . Former smoker 09/21/2016  . Allergic rhinitis 09/21/2016    History reviewed. No pertinent surgical history.     Family History  Problem Relation Age of Onset  . Hypertension Mother   . Arthritis Maternal Grandmother   . Hypertension Maternal Grandmother   . Arthritis Maternal Grandfather   . Cancer Maternal Grandfather   . Hypertension Paternal Grandmother     Social History   Tobacco Use  . Smoking status: Former Games developer  . Smokeless tobacco: Never Used  Substance Use Topics  . Alcohol use: Yes    Comment: occasionally  . Drug use: No    Home Medications Prior to Admission medications   Not on File    Allergies    Patient has no known allergies.  Review of Systems   Review of Systems  Constitutional: Positive for appetite change and fever. Negative for chills.  HENT: Negative for congestion.   Eyes: Negative for visual disturbance.  Respiratory: Negative for shortness of breath.   Cardiovascular: Negative for chest pain.  Gastrointestinal: Positive for abdominal pain, diarrhea and vomiting.  Genitourinary: Negative for dysuria and flank pain.  Musculoskeletal: Negative for back pain, neck pain and neck stiffness.  Skin: Negative for rash.  Neurological: Negative for light-headedness and headaches.    Physical Exam Updated Vital Signs BP (!)  107/59 (BP Location: Right Arm)   Pulse 97   Temp 99.3 F (37.4 C) (Oral)   Resp 18   Ht 6\' 4"  (1.93 m)   Wt 136.1 kg   SpO2 98%   BMI 36.52 kg/m   Physical Exam Vitals and nursing note reviewed.  Constitutional:      Appearance: He is well-developed.  HENT:     Head: Normocephalic and atraumatic.     Comments: Dry mm Eyes:     General:        Right eye: No discharge.        Left eye: No discharge.     Conjunctiva/sclera: Conjunctivae normal.  Neck:     Trachea: No tracheal deviation.  Cardiovascular:     Rate and Rhythm: Regular rhythm. Tachycardia present.  Pulmonary:     Effort: Pulmonary effort is normal.     Breath sounds: Normal breath sounds.  Abdominal:     General: There is no distension.     Palpations: Abdomen is soft.     Tenderness: There is abdominal tenderness in the epigastric area. There is no guarding.  Musculoskeletal:     Cervical back: Normal range of motion and neck supple.  Skin:    General: Skin is warm.     Findings: No rash.  Neurological:     Mental Status: He is alert and oriented to person, place, and time.     ED Results / Procedures / Treatments   Labs (all  labs ordered are listed, but only abnormal results are displayed) Labs Reviewed  COMPREHENSIVE METABOLIC PANEL - Abnormal; Notable for the following components:      Result Value   Potassium 3.4 (*)    Glucose, Bld 136 (*)    Calcium 8.8 (*)    All other components within normal limits  CBC WITH DIFFERENTIAL/PLATELET - Abnormal; Notable for the following components:   RBC 4.19 (*)    HCT 38.5 (*)    Neutro Abs 7.8 (*)    All other components within normal limits  URINALYSIS, ROUTINE W REFLEX MICROSCOPIC - Abnormal; Notable for the following components:   Protein, ur 30 (*)    All other components within normal limits  CULTURE, BLOOD (ROUTINE X 2)  CULTURE, BLOOD (ROUTINE X 2)  URINE CULTURE  SARS CORONAVIRUS 2 BY RT PCR (HOSPITAL ORDER, PERFORMED IN [facility])  RESPIRATORY PANEL BY PCR  MAGNESIUM  LIPASE, BLOOD  LACTIC ACID, PLASMA  LACTIC ACID, PLASMA  APTT  PROTIME-INR    EKG None  Radiology DG Chest 2 View  Result Date: 08/28/2019 CLINICAL DATA:  Fever EXAM: CHEST - 2 VIEW COMPARISON:  09/05/2016 FINDINGS: The heart size and mediastinal contours are within normal limits. Diffuse interstitial opacity. The visualized skeletal structures are unremarkable. IMPRESSION: Diffuse interstitial opacity, consistent with edema or atypical/viral infection. No focal airspace opacity. Electronically Signed   By: Lauralyn Primes M.D.   On: 08/28/2019 12:22    Procedures .Critical Care Performed by: Blane Ohara, MD Authorized by: Blane Ohara, MD   Critical care provider statement:    Critical care time (minutes):  80   Critical care start time:  08/28/2019 10:00 AM   Critical care end time:  08/28/2019 11:20 AM   Critical care time was exclusive of:  Separately billable procedures and treating other patients and teaching time   Critical care was necessary to treat or prevent imminent or life-threatening deterioration of the following conditions:  Sepsis   Critical care was time spent personally by me on the following activities:  Evaluation of patient's response to treatment, examination of patient, ordering and performing treatments and interventions, ordering and review of laboratory studies, pulse oximetry, re-evaluation of patient's condition and review of old charts   (including critical care time)  Medications Ordered in ED Medications  azithromycin (ZITHROMAX) 500 mg in sodium chloride 0.9 % 250 mL IVPB (has no administration in time range)  sodium chloride 0.9 % bolus 1,000 mL (0 mLs Intravenous Stopped 08/28/19 0936)  acetaminophen (TYLENOL) tablet 1,000 mg (1,000 mg Oral Given 08/28/19 0905)  famotidine (PEPCID) IVPB 20 mg premix (0 mg Intravenous Stopped 08/28/19 0935)  ondansetron (ZOFRAN) injection 4 mg (4 mg Intravenous  Given 08/28/19 0909)  lactated ringers bolus 1,000 mL (0 mLs Intravenous Stopped 08/28/19 1127)    And  lactated ringers bolus 1,000 mL (0 mLs Intravenous Stopped 08/28/19 1127)    And  lactated ringers bolus 800 mL (0 mLs Intravenous Stopped 08/28/19 1127)  cefTRIAXone (ROCEPHIN) 2 g in sodium chloride 0.9 % 100 mL IVPB (0 g Intravenous Stopped 08/28/19 1127)  metroNIDAZOLE (FLAGYL) IVPB 500 mg (0 mg Intravenous Stopped 08/28/19 1127)    ED Course  I have reviewed the triage vital signs and the nursing notes.  Pertinent labs & imaging results that were available during my care of the patient were reviewed by me and considered in my medical decision making (see chart for details).    MDM Rules/Calculators/A&P  Patient presents with clinical concern for gastroenteritis likely from food poisoning, early sepsis, other differential diagnosis considered however nonfocal abdominal exam with no guarding.  Plan to check electrolytes, IV fluids, stomach medications and reassessment. Patient blood pressure dropped to 80 systolic and spiked a fever with mild tachycardia.  Sepsis protocol initiated.  IV fluid 30 cc/kg ordered and given, Rocephin and Flagyl for antibiotics and blood cultures.  Code sepsis called. Blood work reviewed overall reassuring white blood cell count 3.4, normal white blood cell count, normal hemoglobin. Lactic acid normal. On reassessment patient significantly improved and pain resolved, plan for repeat vital signs.  Sepsis - Repeat Assessment  Performed at:    11:00 am  Vitals     Blood pressure (!) 81/35, pulse (!) 107, temperature (!) 101.1 F (38.4 C), temperature source Oral, resp. rate 18, height 6\' 4"  (1.93 m), weight 136.1 kg, SpO2 95 %.  Heart:     Tachycardic  Lungs:    CTA  Capillary Refill:   <2 sec  Peripheral Pulse:   Radial pulse palpable  Skin:     Normal Color    Patient gradually improved on multiple reassessments.  Lactic  acid normal.  Patient did receive IV antibiotics in case early sepsis.  Discussed with hospitalist for observation/admission who agreed with plan.  Blood pressure improved to 107/59.   Final Clinical Impression(s) / ED Diagnoses Final diagnoses:  Abdominal cramping  Nausea vomiting and diarrhea  Transient hypotension  Fever in adult    Rx / DC Orders ED Discharge Orders    None       , MD 08/28/19 1325

## 2019-08-28 NOTE — Sepsis Progress Note (Signed)
Notified bedside nurse of need to draw lactic acid and blood cultures.  

## 2019-08-28 NOTE — ED Notes (Signed)
Pt refused occult blood

## 2019-08-28 NOTE — ED Notes (Signed)
Unable to provide cardiac monitoring in the patient;s current room.

## 2019-08-29 ENCOUNTER — Inpatient Hospital Stay (HOSPITAL_COMMUNITY): Payer: Self-pay

## 2019-08-29 LAB — CBC
HCT: 39.1 % (ref 39.0–52.0)
Hemoglobin: 13.4 g/dL (ref 13.0–17.0)
MCH: 32.3 pg (ref 26.0–34.0)
MCHC: 34.3 g/dL (ref 30.0–36.0)
MCV: 94.2 fL (ref 80.0–100.0)
Platelets: 140 10*3/uL — ABNORMAL LOW (ref 150–400)
RBC: 4.15 MIL/uL — ABNORMAL LOW (ref 4.22–5.81)
RDW: 11.7 % (ref 11.5–15.5)
WBC: 9 10*3/uL (ref 4.0–10.5)
nRBC: 0 % (ref 0.0–0.2)

## 2019-08-29 LAB — COMPREHENSIVE METABOLIC PANEL
ALT: 15 U/L (ref 0–44)
AST: 13 U/L — ABNORMAL LOW (ref 15–41)
Albumin: 3.8 g/dL (ref 3.5–5.0)
Alkaline Phosphatase: 52 U/L (ref 38–126)
Anion gap: 10 (ref 5–15)
BUN: 8 mg/dL (ref 6–20)
CO2: 25 mmol/L (ref 22–32)
Calcium: 8.3 mg/dL — ABNORMAL LOW (ref 8.9–10.3)
Chloride: 101 mmol/L (ref 98–111)
Creatinine, Ser: 0.76 mg/dL (ref 0.61–1.24)
GFR calc Af Amer: >60 mL/min (ref >60)
GFR calc non Af Amer: >60 mL/min (ref >60)
Glucose, Bld: 100 mg/dL — ABNORMAL HIGH (ref 70–99)
Potassium: 3.4 mmol/L — ABNORMAL LOW (ref 3.5–5.1)
Sodium: 136 mmol/L (ref 135–145)
Total Bilirubin: 1 mg/dL (ref 0.3–1.2)
Total Protein: 6.7 g/dL (ref 6.5–8.1)

## 2019-08-29 LAB — C DIFFICILE QUICK SCREEN W PCR REFLEX

## 2019-08-29 LAB — URINE CULTURE: Culture: NO GROWTH

## 2019-08-29 LAB — PROCALCITONIN: Procalcitonin: <0.1 ng/mL

## 2019-08-29 LAB — TROPONIN I (HIGH SENSITIVITY)
Troponin I (High Sensitivity): 6 ng/L (ref <18)
Troponin I (High Sensitivity): 7 ng/L (ref <18)

## 2019-08-29 LAB — HIV ANTIBODY (ROUTINE TESTING W REFLEX): HIV Screen 4th Generation wRfx: NONREACTIVE

## 2019-08-29 LAB — OCCULT BLOOD X 1 CARD TO LAB, STOOL: Fecal Occult Bld: NEGATIVE

## 2019-08-29 LAB — MAGNESIUM: Magnesium: 1.9 mg/dL (ref 1.7–2.4)

## 2019-08-29 MED ORDER — PANTOPRAZOLE SODIUM 40 MG PO TBEC
40.0000 mg | DELAYED_RELEASE_TABLET | Freq: Every day | ORAL | 1 refills | Status: AC
Start: 2019-08-29 — End: 2020-08-28

## 2019-08-29 MED ORDER — ONDANSETRON HCL 4 MG PO TABS: 4.0000 mg | ORAL_TABLET | Freq: Four times a day (QID) | ORAL | 0 refills | Status: AC | PRN

## 2019-08-29 NOTE — Progress Notes (Signed)
Patient complaining of sharp chest pain rated at a 6 to the mid chest. Denies shortness of breath. Patient stated the pain started when he woke up this morning. Vital signs obtained. MD notified. New order for EKG and troponins.

## 2019-08-29 NOTE — Progress Notes (Signed)
Discharge instruction given and explained. Patient verbalized understanding. Pt to follow up with PCP ASAP. Patient accompanied to private vehicle for transport home by family with staff. Patient in stable condition at time of discharge.

## 2019-08-29 NOTE — Discharge Summary (Signed)
Physician Discharge Summary  ADIAN JABLONOWSKI ZOX:[id number] DOB: 1985-06-26 DOA: 08/28/2019  PCP: [facility]  Admit date: 08/28/2019  Discharge date: 08/29/2019  Admitted From:Home  Disposition:  Home  Recommendations for Outpatient Follow-up:  1. Follow up with PCP in 1-2 weeks 2. Please obtain BMP/CBC in one week 3. Continue on Protonix daily as prescribed and use Tums as needed for indigestion at home 4. Zofran given as needed for any nausea or vomiting symptoms 5. May use Imodium over-the-counter as needed for any diarrhea  Home Health: None  Equipment/Devices: None  Discharge Condition: Stable  CODE STATUS: Full  Diet recommendation: Regular  Brief/Interim Summary: Per HPI: [name] is a 34 y.o. male with medical history significant of ADD, arthritis, GERD who presents with nausea/vomiting/diarrhea and abdominal pain.  Pt notes his sx started several hours after eating sushi yesterday afternoon.  He notes about 2 hours later he started to notice N/V/D and abdominal discomfort.  He ended up going to work, but had progressive symptoms with abdominal cramping, 3-4 episodes of emesis yesterday.  Describes emesis as black and occasionally bloody.  Stool is described similarly in color.  He notes fever.  He notes SOB, no chest pain.  No one else around him is sick.  He did not get covid vaccine.  No etoh, no smoking, no other drugs.     Patient was admitted with sepsis secondary to colitis/gastroenteritis.  He appears to have had some likely viral gastroenteritis after consuming sushi.  He has had no further episodes of nausea or vomiting or diarrhea since admission and is able to tolerate a full diet today.  His respiratory viral panel has returned negative and CT chest does not show any acute abnormalities.  He was started on Rocephin and azithromycin while here, but does not appear to require any antibiotics at home as this is likely viral.  He has not  had any fevers or chills or leukocytosis.  He was noted to have some substernal chest burning sensation on day of discharge and this was evaluated with EKG and troponins which have returned within normal limits.  It appears that he is still having some indigestion for which I will prescribe some Protonix to use daily with Tums as needed.  He will also be given some Zofran if needed for nausea or vomiting.  He has been set up with PCP to follow-up with on discharge as well.  No other acute events noted throughout the course of this admission.  He is overall stable for discharge today.  Discharge Diagnoses:  Active Problems:   Sepsis [facility])  Principal discharge diagnosis: Sepsis secondary to gastroenteritis/colitis-suspect viral etiology.  Discharge Instructions  Discharge Instructions    Diet - low sodium heart healthy   Complete by: As directed    Increase activity slowly   Complete by: As directed      Allergies as of 08/29/2019   No Known Allergies     Medication List    TAKE these medications   calcium carbonate 500 MG chewable tablet Commonly known as: TUMS - dosed in mg elemental calcium Chew 1 tablet by mouth daily.   ondansetron 4 MG tablet Commonly known as: ZOFRAN Take 1 tablet (4 mg total) by mouth every 6 (six) hours as needed for nausea.   pantoprazole 40 MG tablet Commonly known as: Protonix Take 1 tablet (40 mg total) by mouth daily.       Follow-up Information    Schedule an appointment  as soon as possible for a visit  with [facility].   Contact information: [address] [place] [id number] [phone]              No Known Allergies  Consultations:  None   Procedures/Studies: DG Chest 2 View  Result Date: 08/28/2019 CLINICAL DATA:  Fever EXAM: CHEST - 2 VIEW COMPARISON:  09/05/2016 FINDINGS: The heart size and mediastinal contours are within normal limits. Diffuse interstitial opacity. The visualized skeletal structures are  unremarkable. IMPRESSION: Diffuse interstitial opacity, consistent with edema or atypical/viral infection. No focal airspace opacity. Electronically Signed   By: Lauralyn PrimesAlex  Bibbey M.D.   On: 08/28/2019 12:22   CT CHEST W CONTRAST  Result Date: 08/28/2019 CLINICAL DATA:  Pain.  Shortness of breath. EXAM: CT CHEST, ABDOMEN, AND PELVIS WITH CONTRAST TECHNIQUE: Multidetector CT imaging of the chest, abdomen and pelvis was performed following the standard protocol during bolus administration of intravenous contrast. CONTRAST:  100mL OMNIPAQUE IOHEXOL 300 MG/ML  SOLN COMPARISON:  None. FINDINGS: CT CHEST FINDINGS Cardiovascular: The heart size is mildly enlarged. There is no significant pericardial effusion. There is no large centrally located pulmonary embolism. The thoracic aorta is unremarkable. Mediastinum/Nodes: -- No mediastinal lymphadenopathy. -- No hilar lymphadenopathy. -- No axillary lymphadenopathy. -- No supraclavicular lymphadenopathy. -- Normal thyroid gland where visualized. -  Unremarkable esophagus. Lungs/Pleura: There is atelectasis at the lung bases. There is no significant pleural effusion. No large focal infiltrate. No pneumothorax. The trachea is unremarkable. Musculoskeletal: No chest wall abnormality. No bony spinal canal stenosis. CT ABDOMEN PELVIS FINDINGS Hepatobiliary: There is decreased hepatic attenuation suggestive of hepatic steatosis. Normal gallbladder.There is no biliary ductal dilation. Pancreas: Normal contours without ductal dilatation. No peripancreatic fluid collection. Spleen: Unremarkable. Adrenals/Urinary Tract: --Adrenal glands: Unremarkable. --Right kidney/ureter: No hydronephrosis or radiopaque kidney stones. --Left kidney/ureter: No hydronephrosis or radiopaque kidney stones. --Urinary bladder: Unremarkable. Stomach/Bowel: --Stomach/Duodenum: No hiatal hernia or other gastric abnormality. Normal duodenal course and caliber. --Small bowel: Unremarkable. --Colon: There is  apparent wall thickening and fat stranding about the ascending colon in cecum to the level of the hepatic flexure. There is liquid stool at this level. --Appendix: Normal. Vascular/Lymphatic: Normal course and caliber of the major abdominal vessels. --No retroperitoneal lymphadenopathy. --No mesenteric lymphadenopathy. --No pelvic or inguinal lymphadenopathy. Reproductive: Unremarkable Other: No ascites or free air. There are small bilateral fat containing inguinal hernias. There is a small fat containing umbilical hernia. Musculoskeletal. No acute displaced fractures. IMPRESSION: 1. Apparent wall thickening and fat stranding about the ascending colon in cecum to the level of the hepatic flexure, concerning for infectious or inflammatory colitis. 2. Hepatic steatosis. 3. Mild cardiomegaly. Electronically Signed   By: Katherine Mantlehristopher  Green M.D.   On: 08/28/2019 17:33   CT ABDOMEN PELVIS W CONTRAST  Result Date: 08/28/2019 CLINICAL DATA:  Pain.  Shortness of breath. EXAM: CT CHEST, ABDOMEN, AND PELVIS WITH CONTRAST TECHNIQUE: Multidetector CT imaging of the chest, abdomen and pelvis was performed following the standard protocol during bolus administration of intravenous contrast. CONTRAST:  100mL OMNIPAQUE IOHEXOL 300 MG/ML  SOLN COMPARISON:  None. FINDINGS: CT CHEST FINDINGS Cardiovascular: The heart size is mildly enlarged. There is no significant pericardial effusion. There is no large centrally located pulmonary embolism. The thoracic aorta is unremarkable. Mediastinum/Nodes: -- No mediastinal lymphadenopathy. -- No hilar lymphadenopathy. -- No axillary lymphadenopathy. -- No supraclavicular lymphadenopathy. -- Normal thyroid gland where visualized. -  Unremarkable esophagus. Lungs/Pleura: There is atelectasis at the lung bases. There is no significant  pleural effusion. No large focal infiltrate. No pneumothorax. The trachea is unremarkable. Musculoskeletal: No chest wall abnormality. No bony spinal canal  stenosis. CT ABDOMEN PELVIS FINDINGS Hepatobiliary: There is decreased hepatic attenuation suggestive of hepatic steatosis. Normal gallbladder.There is no biliary ductal dilation. Pancreas: Normal contours without ductal dilatation. No peripancreatic fluid collection. Spleen: Unremarkable. Adrenals/Urinary Tract: --Adrenal glands: Unremarkable. --Right kidney/ureter: No hydronephrosis or radiopaque kidney stones. --Left kidney/ureter: No hydronephrosis or radiopaque kidney stones. --Urinary bladder: Unremarkable. Stomach/Bowel: --Stomach/Duodenum: No hiatal hernia or other gastric abnormality. Normal duodenal course and caliber. --Small bowel: Unremarkable. --Colon: There is apparent wall thickening and fat stranding about the ascending colon in cecum to the level of the hepatic flexure. There is liquid stool at this level. --Appendix: Normal. Vascular/Lymphatic: Normal course and caliber of the major abdominal vessels. --No retroperitoneal lymphadenopathy. --No mesenteric lymphadenopathy. --No pelvic or inguinal lymphadenopathy. Reproductive: Unremarkable Other: No ascites or free air. There are small bilateral fat containing inguinal hernias. There is a small fat containing umbilical hernia. Musculoskeletal. No acute displaced fractures. IMPRESSION: 1. Apparent wall thickening and fat stranding about the ascending colon in cecum to the level of the hepatic flexure, concerning for infectious or inflammatory colitis. 2. Hepatic steatosis. 3. Mild cardiomegaly. Electronically Signed   By: Katherine Mantle M.D.   On: 08/28/2019 17:33   DG Chest Port 1 View  Result Date: 08/29/2019 CLINICAL DATA:  Abdominal pain. EXAM: PORTABLE CHEST 1 VIEW COMPARISON:  08/28/2019 FINDINGS: The cardiac silhouette, mediastinal and hilar contours are normal. Streaky bibasilar subsegmental atelectasis but no infiltrates, edema or effusions. No pulmonary lesions. The bony thorax is intact. IMPRESSION: Streaky bibasilar subsegmental  atelectasis. Electronically Signed   By: Rudie Meyer M.D.   On: 08/29/2019 06:23   Discharge Exam: Vitals:   08/29/19 0000 08/29/19 0422  BP: 125/87 127/80  Pulse: (!) 101 93  Resp: 19 (!) 22  Temp: 98.9 F (37.2 C) 98.8 F (37.1 C)  SpO2: 100% 96%   Vitals:   08/28/19 2000 08/29/19 0000 08/29/19 0422 08/29/19 0500  BP: 127/66 125/87 127/80   Pulse: (!) 111 (!) 101 93   Resp: 18 19 (!) 22   Temp: (!) 100.7 F (38.2 C) 98.9 F (37.2 C) 98.8 F (37.1 C)   TempSrc: Oral Oral Oral   SpO2: 96% 100% 96%   Weight:    (!) 137.3 kg  Height:        General: Pt is alert, awake, not in acute distress Cardiovascular: RRR, S1/S2 +, no rubs, no gallops Respiratory: CTA bilaterally, no wheezing, no rhonchi Abdominal: Soft, NT, ND, bowel sounds + Extremities: no edema, no cyanosis    The results of significant diagnostics from this hospitalization (including imaging, microbiology, ancillary and laboratory) are listed below for reference.     Microbiology: Recent Results (from the past 240 hour(s))  Blood Culture (routine x 2)     Status: None (Preliminary result)   Collection Time: 08/28/19 10:08 AM   Specimen: BLOOD LEFT ARM  Result Value Ref Range Status   Specimen Description BLOOD LEFT ARM  Final   Special Requests   Final    BOTTLES DRAWN AEROBIC AND ANAEROBIC Blood Culture adequate volume   Culture   Final    NO GROWTH < 24 HOURS Performed at [facility], [address], [place], [place] [address]    Report Status PENDING  Incomplete  Blood Culture (routine x 2)     Status: None (Preliminary result)   Collection Time: 08/28/19 10:08 AM  Specimen: BLOOD RIGHT ARM  Result Value Ref Range Status   Specimen Description BLOOD RIGHT ARM  Final   Special Requests   Final    BOTTLES DRAWN AEROBIC AND ANAEROBIC Blood Culture results may not be optimal due to an inadequate volume of blood received in culture bottles   Culture   Final    NO GROWTH < 24 HOURS Performed at  [facility], [address] [place], [place] [address]    Report Status PENDING  Incomplete  SARS Coronavirus 2 by RT PCR (hospital order, performed in [facility] hospital lab)     Status: None   Collection Time: 08/28/19  1:30 PM  Result Value Ref Range Status   SARS Coronavirus 2 NEGATIVE NEGATIVE Final    Comment: (NOTE) SARS-CoV-2 target nucleic acids are NOT DETECTED.  The SARS-CoV-2 RNA is generally detectable in upper and lower respiratory specimens during the acute phase of infection. The lowest concentration of SARS-CoV-2 viral copies this assay can detect is 250 copies / mL. A negative result does not preclude SARS-CoV-2 infection and should not be used as the sole basis for treatment or other patient management decisions.  A negative result may occur with improper specimen collection / handling, submission of specimen other than nasopharyngeal swab, presence of viral mutation(s) within the areas targeted by this assay, and inadequate number of viral copies (<250 copies / mL). A negative result must be combined with clinical observations, patient history, and epidemiological information.  Fact Sheet for Patients:   BoilerBrush.com.cy  Fact Sheet for Healthcare Providers: https://pope.com/  This test is not yet approved or  cleared by the [place] FDA and has been authorized for detection and/or diagnosis of SARS-CoV-2 by FDA under an Emergency Use Authorization (EUA).  This EUA will remain in effect (meaning this test can be used) for the duration of the COVID-19 declaration under Section 564(b)(1) of the Act, 21 U.S.C. section 360bbb-3(b)(1), unless the authorization is terminated or revoked sooner.  Performed at [facility], [address] [place], [place] [address]   Respiratory Panel by PCR     Status: None   Collection Time: 08/28/19  1:36 PM   Specimen: Nasopharyngeal Swab; Respiratory  Result Value Ref  Range Status   Adenovirus NOT DETECTED NOT DETECTED Final   Coronavirus 229E NOT DETECTED NOT DETECTED Final    Comment: (NOTE) The Coronavirus on the Respiratory Panel, DOES NOT test for the novel  Coronavirus (2019 nCoV)    Coronavirus HKU1 NOT DETECTED NOT DETECTED Final   Coronavirus NL63 NOT DETECTED NOT DETECTED Final   Coronavirus OC43 NOT DETECTED NOT DETECTED Final   Metapneumovirus NOT DETECTED NOT DETECTED Final   Rhinovirus / Enterovirus NOT DETECTED NOT DETECTED Final   Influenza A NOT DETECTED NOT DETECTED Final   Influenza B NOT DETECTED NOT DETECTED Final   Parainfluenza Virus 1 NOT DETECTED NOT DETECTED Final   Parainfluenza Virus 2 NOT DETECTED NOT DETECTED Final   Parainfluenza Virus 3 NOT DETECTED NOT DETECTED Final   Parainfluenza Virus 4 NOT DETECTED NOT DETECTED Final   Respiratory Syncytial Virus NOT DETECTED NOT DETECTED Final   Bordetella pertussis NOT DETECTED NOT DETECTED Final   Chlamydophila pneumoniae NOT DETECTED NOT DETECTED Final   Mycoplasma pneumoniae NOT DETECTED NOT DETECTED Final    Comment: Performed at [facility], [address], [place], [place] [address]     Labs: BNP (last 3 results) No results for input(s): BNP in the last 8760 hours.  Basic Metabolic Panel: Recent Labs  Lab 08/28/19 0750 08/29/19 0724  NA 137 136  K 3.4* 3.4*  CL 102 101  CO2 24 25  GLUCOSE 136* 100*  BUN 12 8  CREATININE 1.03 0.76  CALCIUM 8.8* 8.3*  MG 1.9 1.9   Liver Function Tests: Recent Labs  Lab 08/28/19 0750 08/29/19 0724  AST 19 13*  ALT 20 15  ALKPHOS 72 52  BILITOT 1.0 1.0  PROT 7.7 6.7  ALBUMIN 4.5 3.8   Recent Labs  Lab 08/28/19 0750  LIPASE 21   No results for input(s): AMMONIA in the last 168 hours. CBC: Recent Labs  Lab 08/28/19 0750 08/29/19 0409  WBC 9.9 9.0  NEUTROABS 7.8*  --   HGB 13.8 13.4  HCT 38.5* 39.1  MCV 91.9 94.2  PLT 154 140*   Cardiac Enzymes: No results for input(s): CKTOTAL, CKMB, CKMBINDEX,  TROPONINI in the last 168 hours. BNP: Invalid input(s): POCBNP CBG: No results for input(s): GLUCAP in the last 168 hours. D-Dimer No results for input(s): DDIMER in the last 72 hours. Hgb A1c No results for input(s): HGBA1C in the last 72 hours. Lipid Profile No results for input(s): CHOL, HDL, LDLCALC, TRIG, CHOLHDL, LDLDIRECT in the last 72 hours. Thyroid function studies No results for input(s): TSH, T4TOTAL, T3FREE, THYROIDAB in the last 72 hours.  Invalid input(s): FREET3 Anemia work up No results for input(s): VITAMINB12, FOLATE, FERRITIN, TIBC, IRON, RETICCTPCT in the last 72 hours. Urinalysis    Component Value Date/Time   COLORURINE YELLOW 08/28/2019 1053   APPEARANCEUR CLEAR 08/28/2019 1053   LABSPEC 1.030 08/28/2019 1053   PHURINE 6.0 08/28/2019 1053   GLUCOSEU NEGATIVE 08/28/2019 1053   HGBUR NEGATIVE 08/28/2019 1053   BILIRUBINUR NEGATIVE 08/28/2019 1053   KETONESUR NEGATIVE 08/28/2019 1053   PROTEINUR 30 (A) 08/28/2019 1053   NITRITE NEGATIVE 08/28/2019 1053   LEUKOCYTESUR NEGATIVE 08/28/2019 1053   Sepsis Labs Invalid input(s): PROCALCITONIN,  WBC,  LACTICIDVEN Microbiology Recent Results (from the past 240 hour(s))  Blood Culture (routine x 2)     Status: None (Preliminary result)   Collection Time: 08/28/19 10:08 AM   Specimen: BLOOD LEFT ARM  Result Value Ref Range Status   Specimen Description BLOOD LEFT ARM  Final   Special Requests   Final    BOTTLES DRAWN AEROBIC AND ANAEROBIC Blood Culture adequate volume   Culture   Final    NO GROWTH < 24 HOURS Performed at [facility], [address], [place], [place] [address]    Report Status PENDING  Incomplete  Blood Culture (routine x 2)     Status: None (Preliminary result)   Collection Time: 08/28/19 10:08 AM   Specimen: BLOOD RIGHT ARM  Result Value Ref Range Status   Specimen Description BLOOD RIGHT ARM  Final   Special Requests   Final    BOTTLES DRAWN AEROBIC AND ANAEROBIC Blood Culture  results may not be optimal due to an inadequate volume of blood received in culture bottles   Culture   Final    NO GROWTH < 24 HOURS Performed at [facility], [address], [place], [place] [address]    Report Status PENDING  Incomplete  SARS Coronavirus 2 by RT PCR (hospital order, performed in [facility] hospital lab)     Status: None   Collection Time: 08/28/19  1:30 PM  Result Value Ref Range Status   SARS Coronavirus 2 NEGATIVE NEGATIVE Final    Comment: (NOTE) SARS-CoV-2 target nucleic  acids are NOT DETECTED.  The SARS-CoV-2 RNA is generally detectable in upper and lower respiratory specimens during the acute phase of infection. The lowest concentration of SARS-CoV-2 viral copies this assay can detect is 250 copies / mL. A negative result does not preclude SARS-CoV-2 infection and should not be used as the sole basis for treatment or other patient management decisions.  A negative result may occur with improper specimen collection / handling, submission of specimen other than nasopharyngeal swab, presence of viral mutation(s) within the areas targeted by this assay, and inadequate number of viral copies (<250 copies / mL). A negative result must be combined with clinical observations, patient history, and epidemiological information.  Fact Sheet for Patients:   BoilerBrush.com.cy  Fact Sheet for Healthcare Providers: https://pope.com/  This test is not yet approved or  cleared by the [place] FDA and has been authorized for detection and/or diagnosis of SARS-CoV-2 by FDA under an Emergency Use Authorization (EUA).  This EUA will remain in effect (meaning this test can be used) for the duration of the COVID-19 declaration under Section 564(b)(1) of the Act, 21 U.S.C. section 360bbb-3(b)(1), unless the authorization is terminated or revoked sooner.  Performed at [facility], [address], [place], [place] [address]    Respiratory Panel by PCR     Status: None   Collection Time: 08/28/19  1:36 PM   Specimen: Nasopharyngeal Swab; Respiratory  Result Value Ref Range Status   Adenovirus NOT DETECTED NOT DETECTED Final   Coronavirus 229E NOT DETECTED NOT DETECTED Final    Comment: (NOTE) The Coronavirus on the Respiratory Panel, DOES NOT test for the novel  Coronavirus (2019 nCoV)    Coronavirus HKU1 NOT DETECTED NOT DETECTED Final   Coronavirus NL63 NOT DETECTED NOT DETECTED Final   Coronavirus OC43 NOT DETECTED NOT DETECTED Final   Metapneumovirus NOT DETECTED NOT DETECTED Final   Rhinovirus / Enterovirus NOT DETECTED NOT DETECTED Final   Influenza A NOT DETECTED NOT DETECTED Final   Influenza B NOT DETECTED NOT DETECTED Final   Parainfluenza Virus 1 NOT DETECTED NOT DETECTED Final   Parainfluenza Virus 2 NOT DETECTED NOT DETECTED Final   Parainfluenza Virus 3 NOT DETECTED NOT DETECTED Final   Parainfluenza Virus 4 NOT DETECTED NOT DETECTED Final   Respiratory Syncytial Virus NOT DETECTED NOT DETECTED Final   Bordetella pertussis NOT DETECTED NOT DETECTED Final   Chlamydophila pneumoniae NOT DETECTED NOT DETECTED Final   Mycoplasma pneumoniae NOT DETECTED NOT DETECTED Final    Comment: Performed at [facility], [address], [place], [place] [address]     Time coordinating discharge: 35 minutes  SIGNED:   Erick Blinks, DO [facility] 08/29/2019, 10:09 AM  If 7PM-7AM, please contact night-coverage [email or web address]

## 2019-08-31 LAB — GASTROINTESTINAL PANEL BY PCR, STOOL (REPLACES STOOL CULTURE)

## 2019-08-31 LAB — CULTURE, RESPIRATORY W GRAM STAIN: Culture: NORMAL

## 2019-09-02 LAB — CULTURE, BLOOD (ROUTINE X 2)
Culture: NO GROWTH
Culture: NO GROWTH
Special Requests: ADEQUATE

## 2019-09-05 LAB — EXPECTORATED SPUTUM ASSESSMENT W GRAM STAIN, RFLX TO RESP C

## 2019-11-06 ENCOUNTER — Other Ambulatory Visit: Payer: Self-pay

## 2019-11-06 ENCOUNTER — Ambulatory Visit (INDEPENDENT_AMBULATORY_CARE_PROVIDER_SITE_OTHER): Payer: Self-pay

## 2019-11-06 ENCOUNTER — Other Ambulatory Visit: Payer: Self-pay | Admitting: Physician Assistant

## 2019-11-06 DIAGNOSIS — Z021 Encounter for pre-employment examination: Secondary | ICD-10-CM

## 2020-09-10 IMAGING — CT CT CHEST W/ CM
2 of 4 series · 14 of 36 positions shown, 17 images · IV contrast (omnipaque)
Comparison: None.

CLINICAL DATA: Pain.  Shortness of breath.

EXAM:
CT CHEST, ABDOMEN, AND PELVIS WITH CONTRAST
TECHNIQUE: Multidetector CT imaging of the chest, abdomen and pelvis was
performed following the standard protocol during bolus
administration of intravenous contrast.
CONTRAST:  100mL OMNIPAQUE IOHEXOL 300 MG/ML  SOLN

[Series 2: cap with · axial · 0.87mm/px · z∈[+818,+1423]mm · 11 of 143 slices shown, 14 images]
[im 11/143  mediastinal]
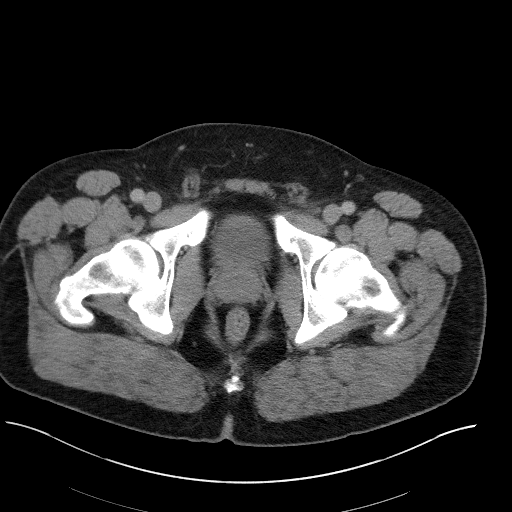
[im 11/143  lung]
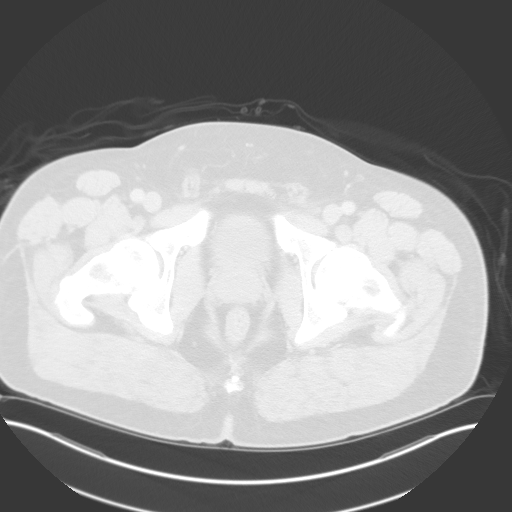
[im 22/143  lung]
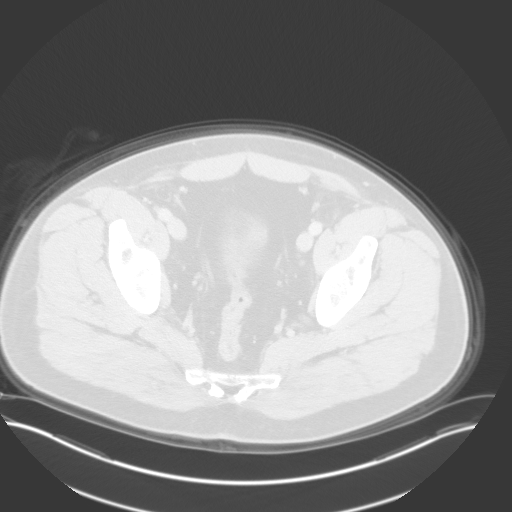
[im 33/143  lung]
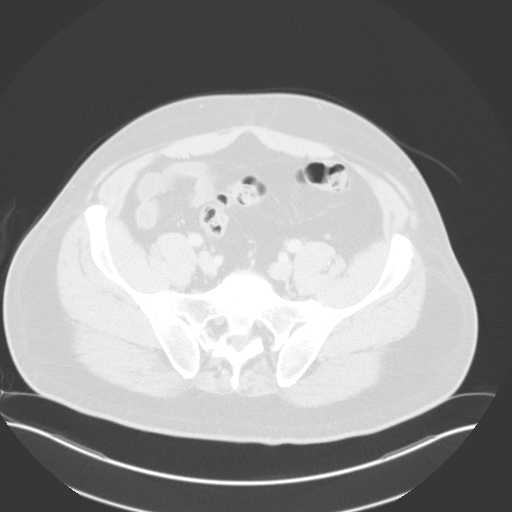
[im 44/143  lung]
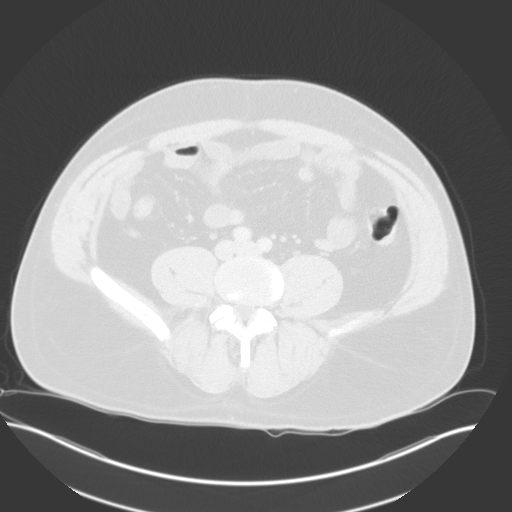
[im 55/143  mediastinal]
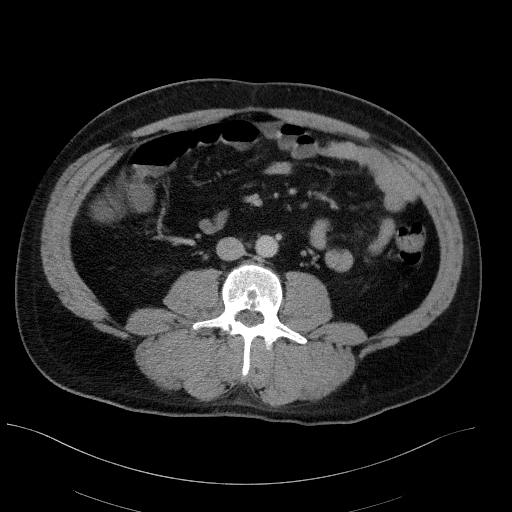
[im 55/143  lung]
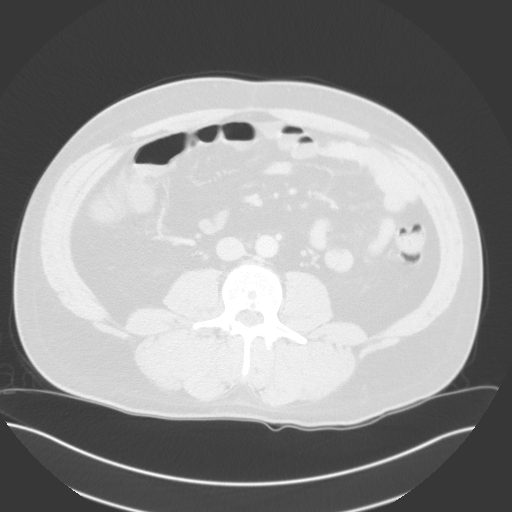
[im 77/143  lung]
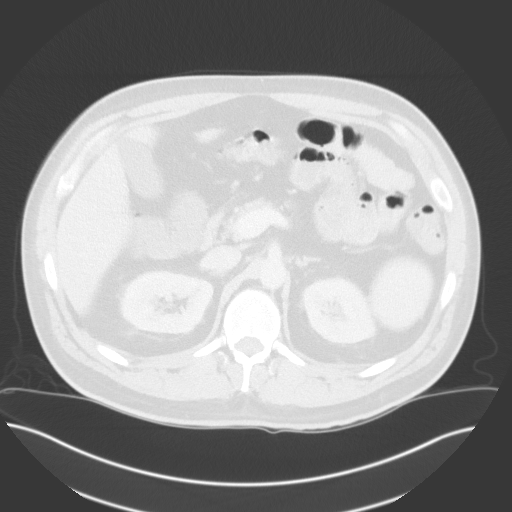
[im 88/143  lung]
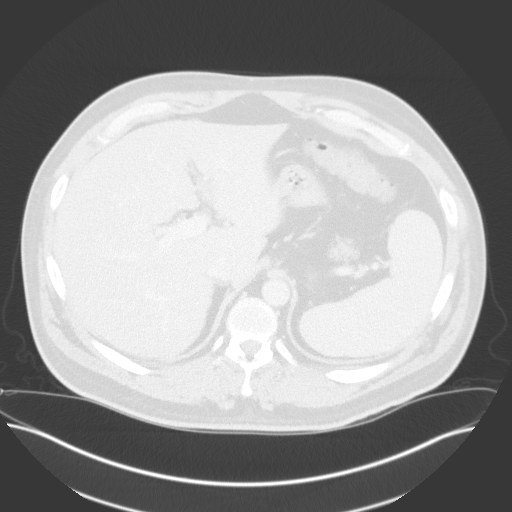
[im 99/143  lung]
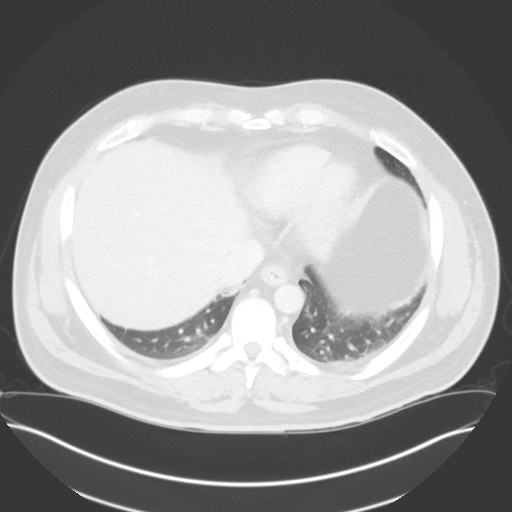
[im 110/143  mediastinal]
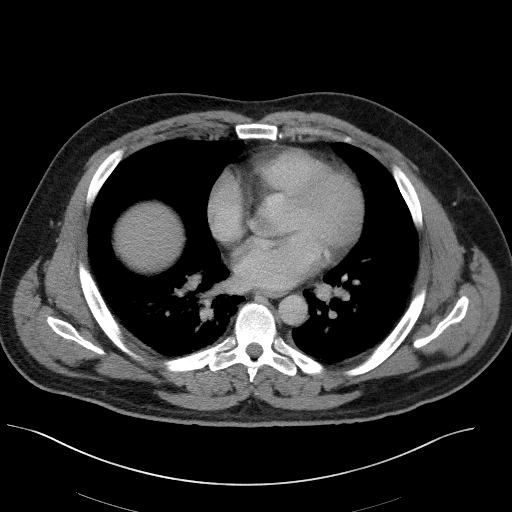
[im 110/143  lung]
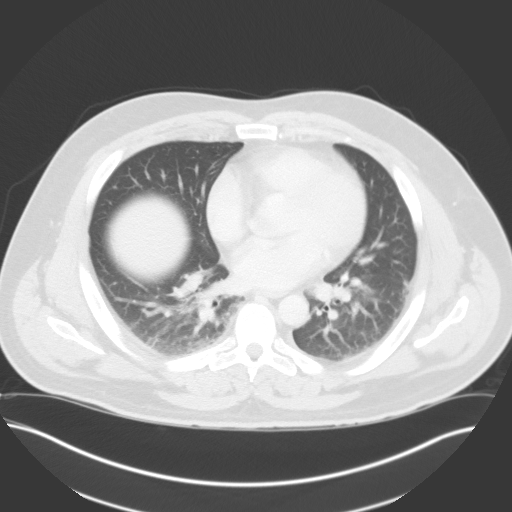
[im 121/143  lung]
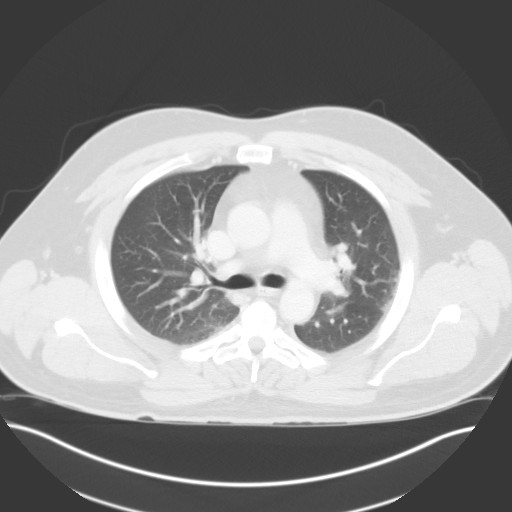
[im 132/143  lung]
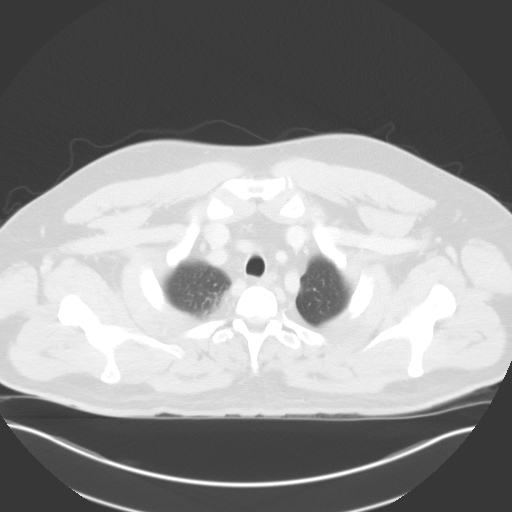

[Series 4: coronals · coronal · 0.94mm/px · 3 of 150 slices shown]
[im 30/150  lung]
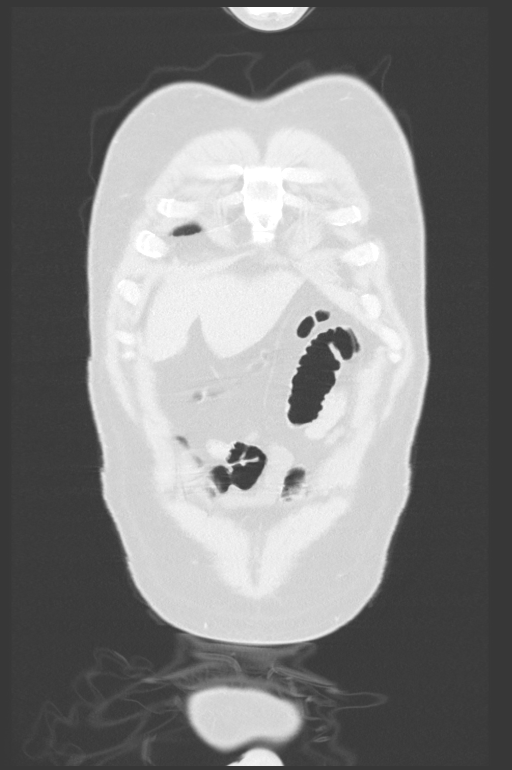
[im 60/150  lung]
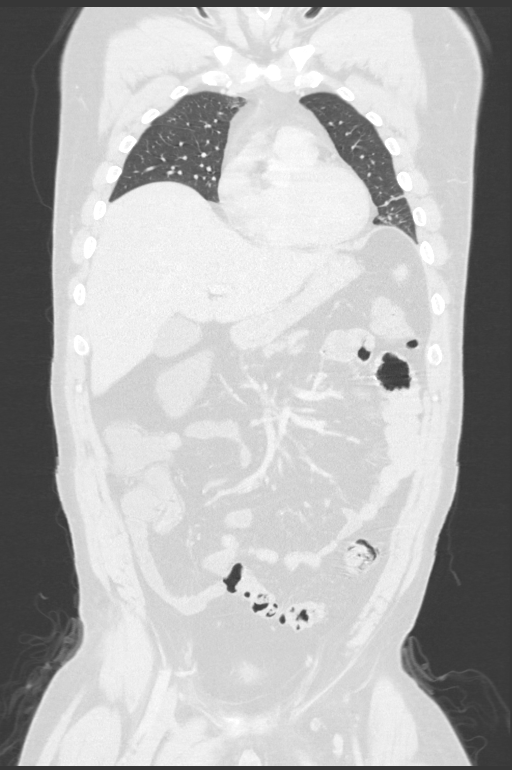
[im 90/150  lung]
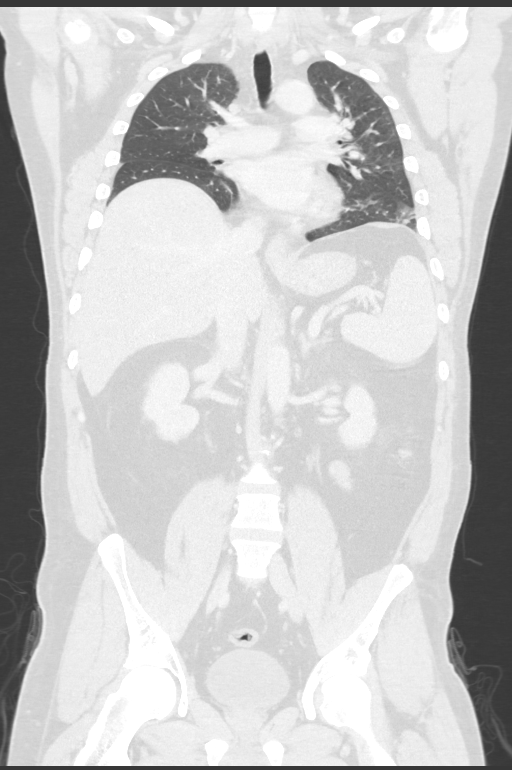

[14 of 36 positions shown; findings below may reference images not displayed]

FINDINGS: CT CHEST FINDINGS

Cardiovascular: The heart size is mildly enlarged. There is no
significant pericardial effusion. There is no large centrally
located pulmonary embolism. The thoracic aorta is unremarkable.

Mediastinum/Nodes:

-- No mediastinal lymphadenopathy.

-- No hilar lymphadenopathy.

-- No axillary lymphadenopathy.

-- No supraclavicular lymphadenopathy.

-- Normal thyroid gland where visualized.

-  Unremarkable esophagus.

Lungs/Pleura: There is atelectasis at the lung bases. There is no
significant pleural effusion. No large focal infiltrate. No
pneumothorax. The trachea is unremarkable.

Musculoskeletal: No chest wall abnormality. No bony spinal canal
stenosis.

CT ABDOMEN PELVIS FINDINGS

Hepatobiliary: There is decreased hepatic attenuation suggestive of
hepatic steatosis. Normal gallbladder.There is no biliary ductal
dilation.

Pancreas: Normal contours without ductal dilatation. No
peripancreatic fluid collection.

Spleen: Unremarkable.

Adrenals/Urinary Tract:

--Adrenal glands: Unremarkable.

--Right kidney/ureter: No hydronephrosis or radiopaque kidney
stones.

--Left kidney/ureter: No hydronephrosis or radiopaque kidney stones.

--Urinary bladder: Unremarkable.

Stomach/Bowel:

--Stomach/Duodenum: No hiatal hernia or other gastric abnormality.
Normal duodenal course and caliber.

--Small bowel: Unremarkable.

--Colon: There is apparent wall thickening and fat stranding about
the ascending colon in cecum to the level of the hepatic flexure.
There is liquid stool at this level.

--Appendix: Normal.

Vascular/Lymphatic: Normal course and caliber of the major abdominal
vessels.

--No retroperitoneal lymphadenopathy.

--No mesenteric lymphadenopathy.

--No pelvic or inguinal lymphadenopathy.

Reproductive: Unremarkable

Other: No ascites or free air. There are small bilateral fat
containing inguinal hernias. There is a small fat containing
umbilical hernia.

Musculoskeletal. No acute displaced fractures.
IMPRESSION: 1. Apparent wall thickening and fat stranding about the ascending
colon in cecum to the level of the hepatic flexure, concerning for
infectious or inflammatory colitis.
2. Hepatic steatosis.
3. Mild cardiomegaly.

## 2020-09-10 IMAGING — DX DG CHEST 2V
2 series · 2 of 2 positions shown · non-contrast
Comparison: 09/05/2016

CLINICAL DATA: Fever

EXAM:
CHEST - 2 VIEW

[chest pa]
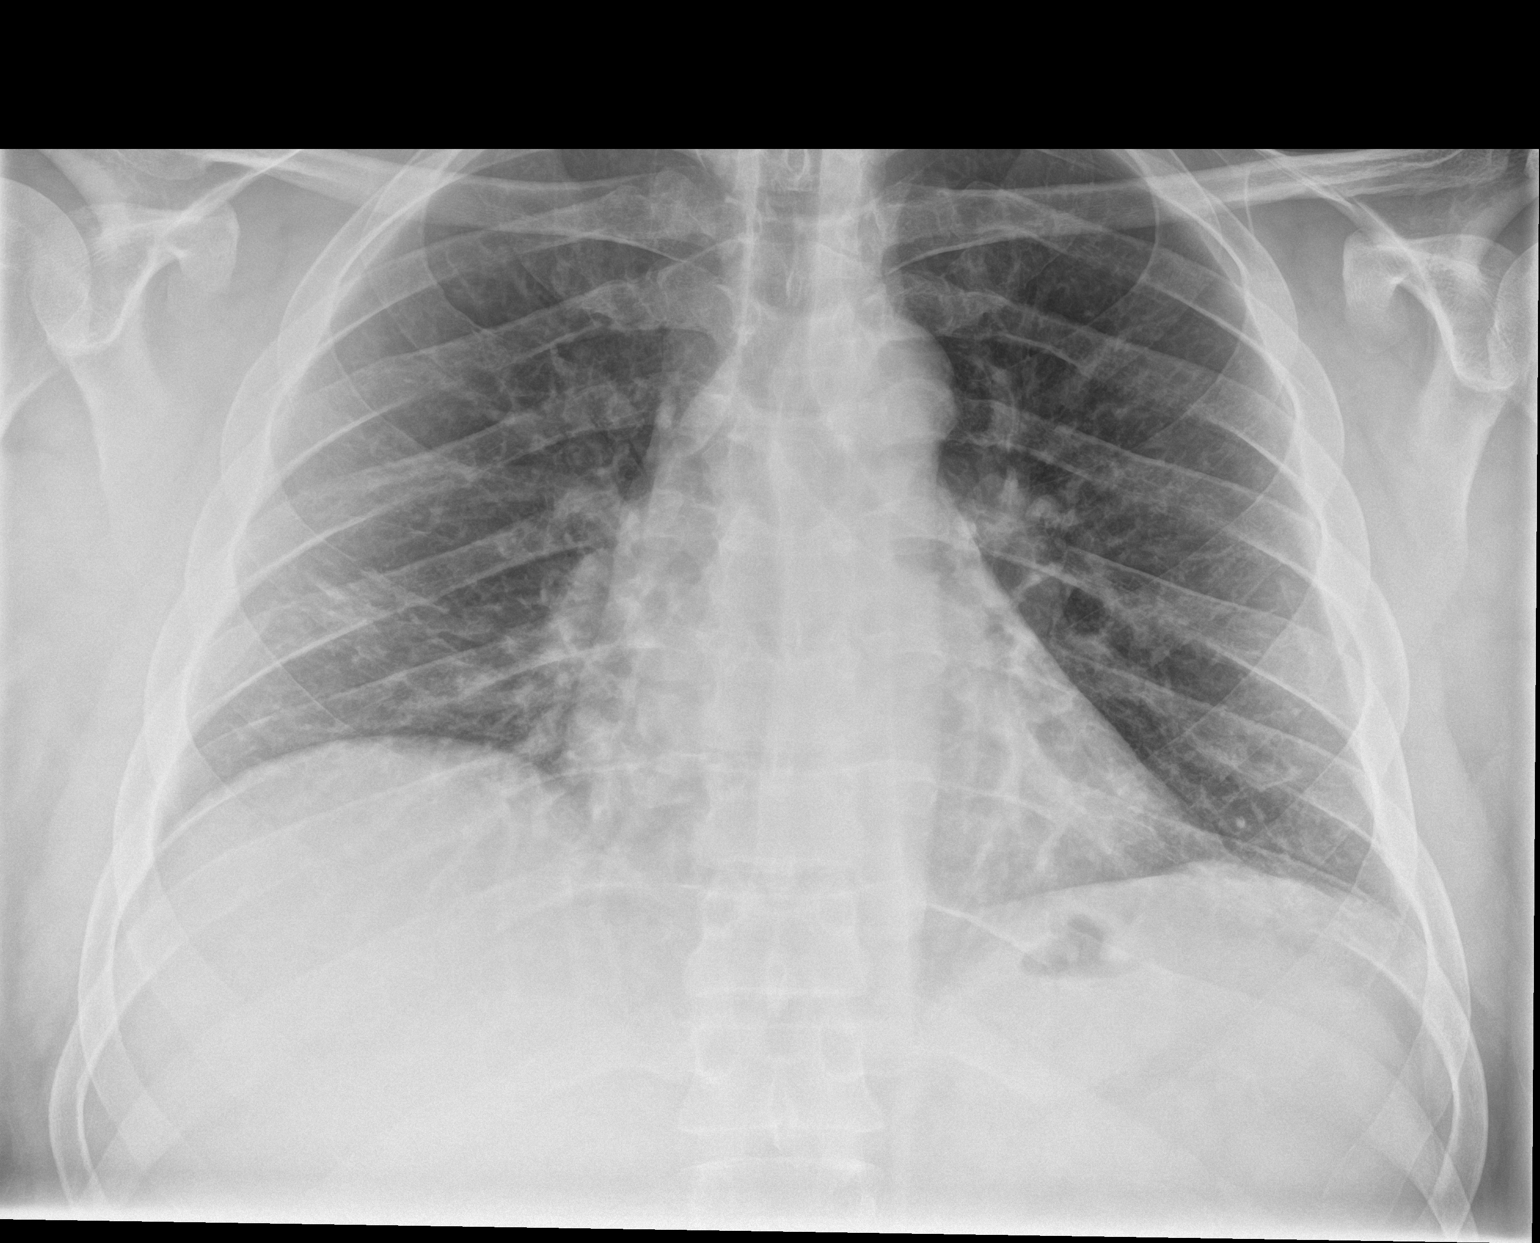

[chest lat]
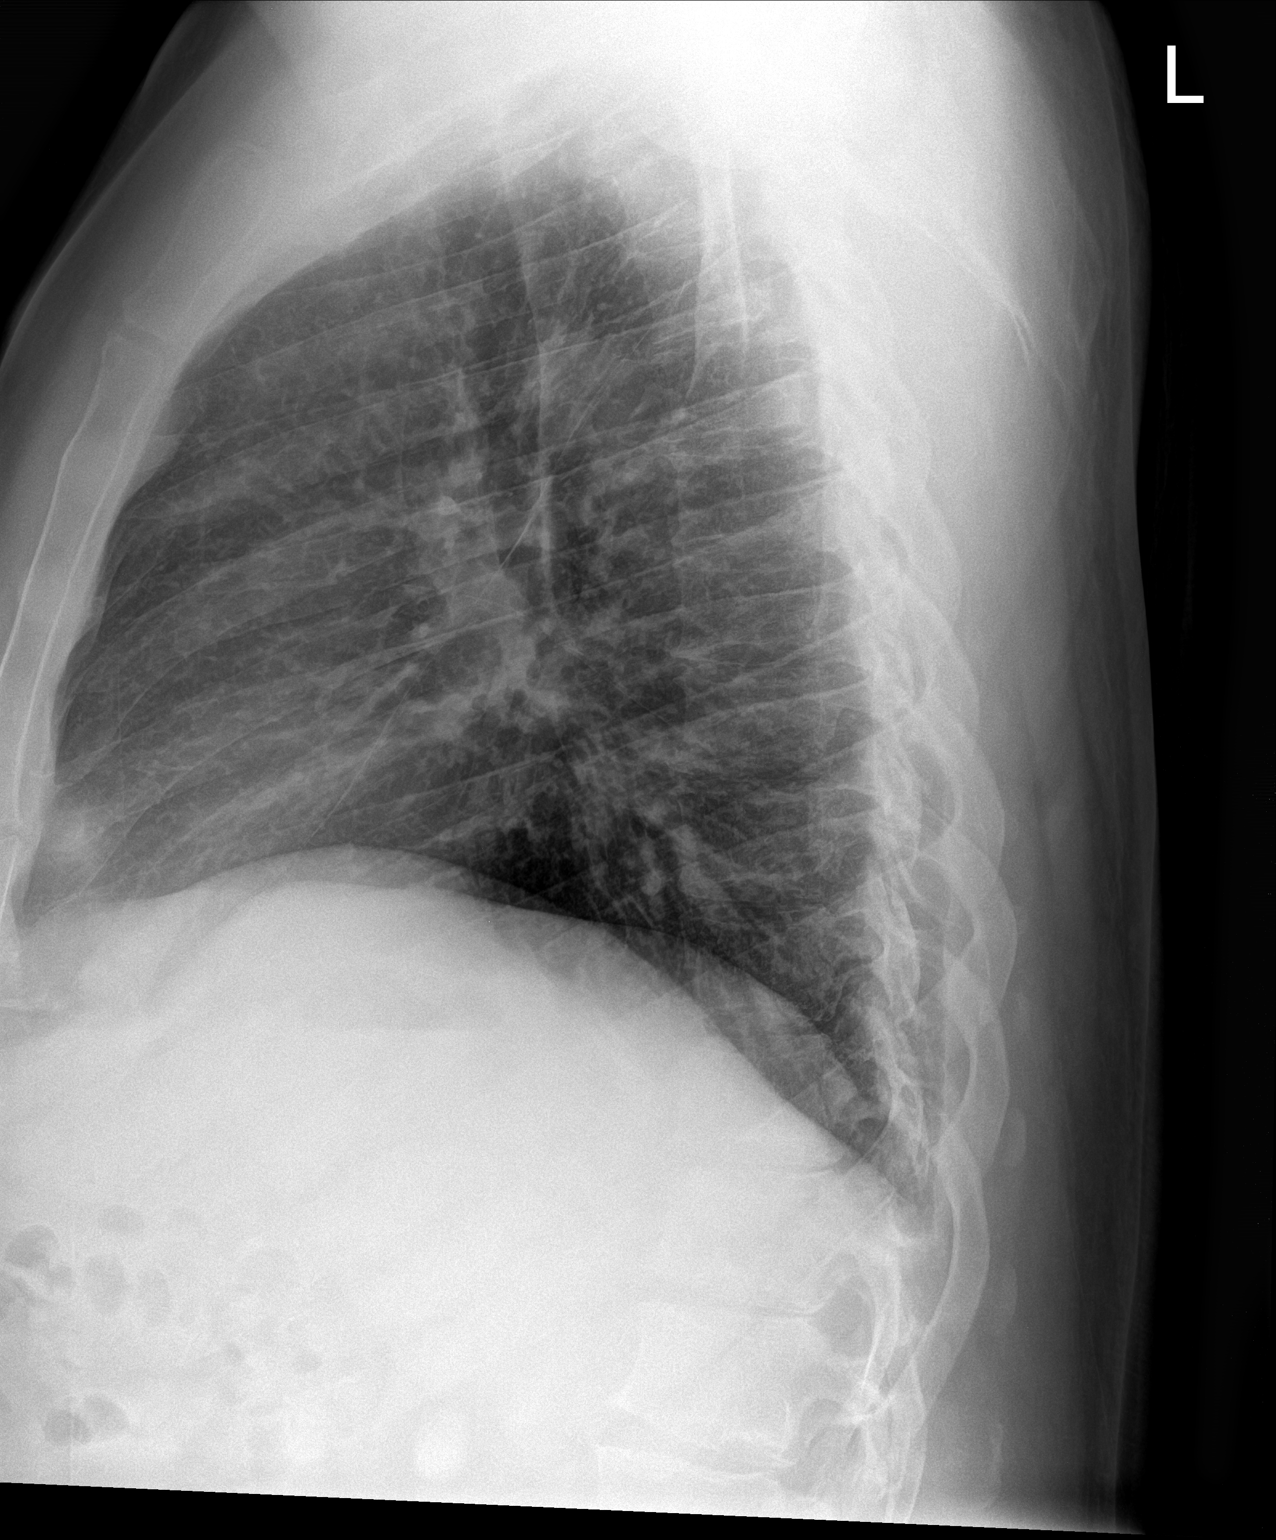

[2 of 2 positions shown; findings below may reference images not displayed]

FINDINGS: The heart size and mediastinal contours are within normal limits.
Diffuse interstitial opacity. The visualized skeletal structures are
unremarkable.
IMPRESSION: Diffuse interstitial opacity, consistent with edema or
atypical/viral infection. No focal airspace opacity.

## 2020-11-19 IMAGING — DX DG CHEST 1V
1 series · 1 of 1 positions shown · non-contrast
Comparison: 08/29/2019

CLINICAL DATA: Pre-employment physical.

EXAM:
CHEST  1 VIEW

[chest pa]
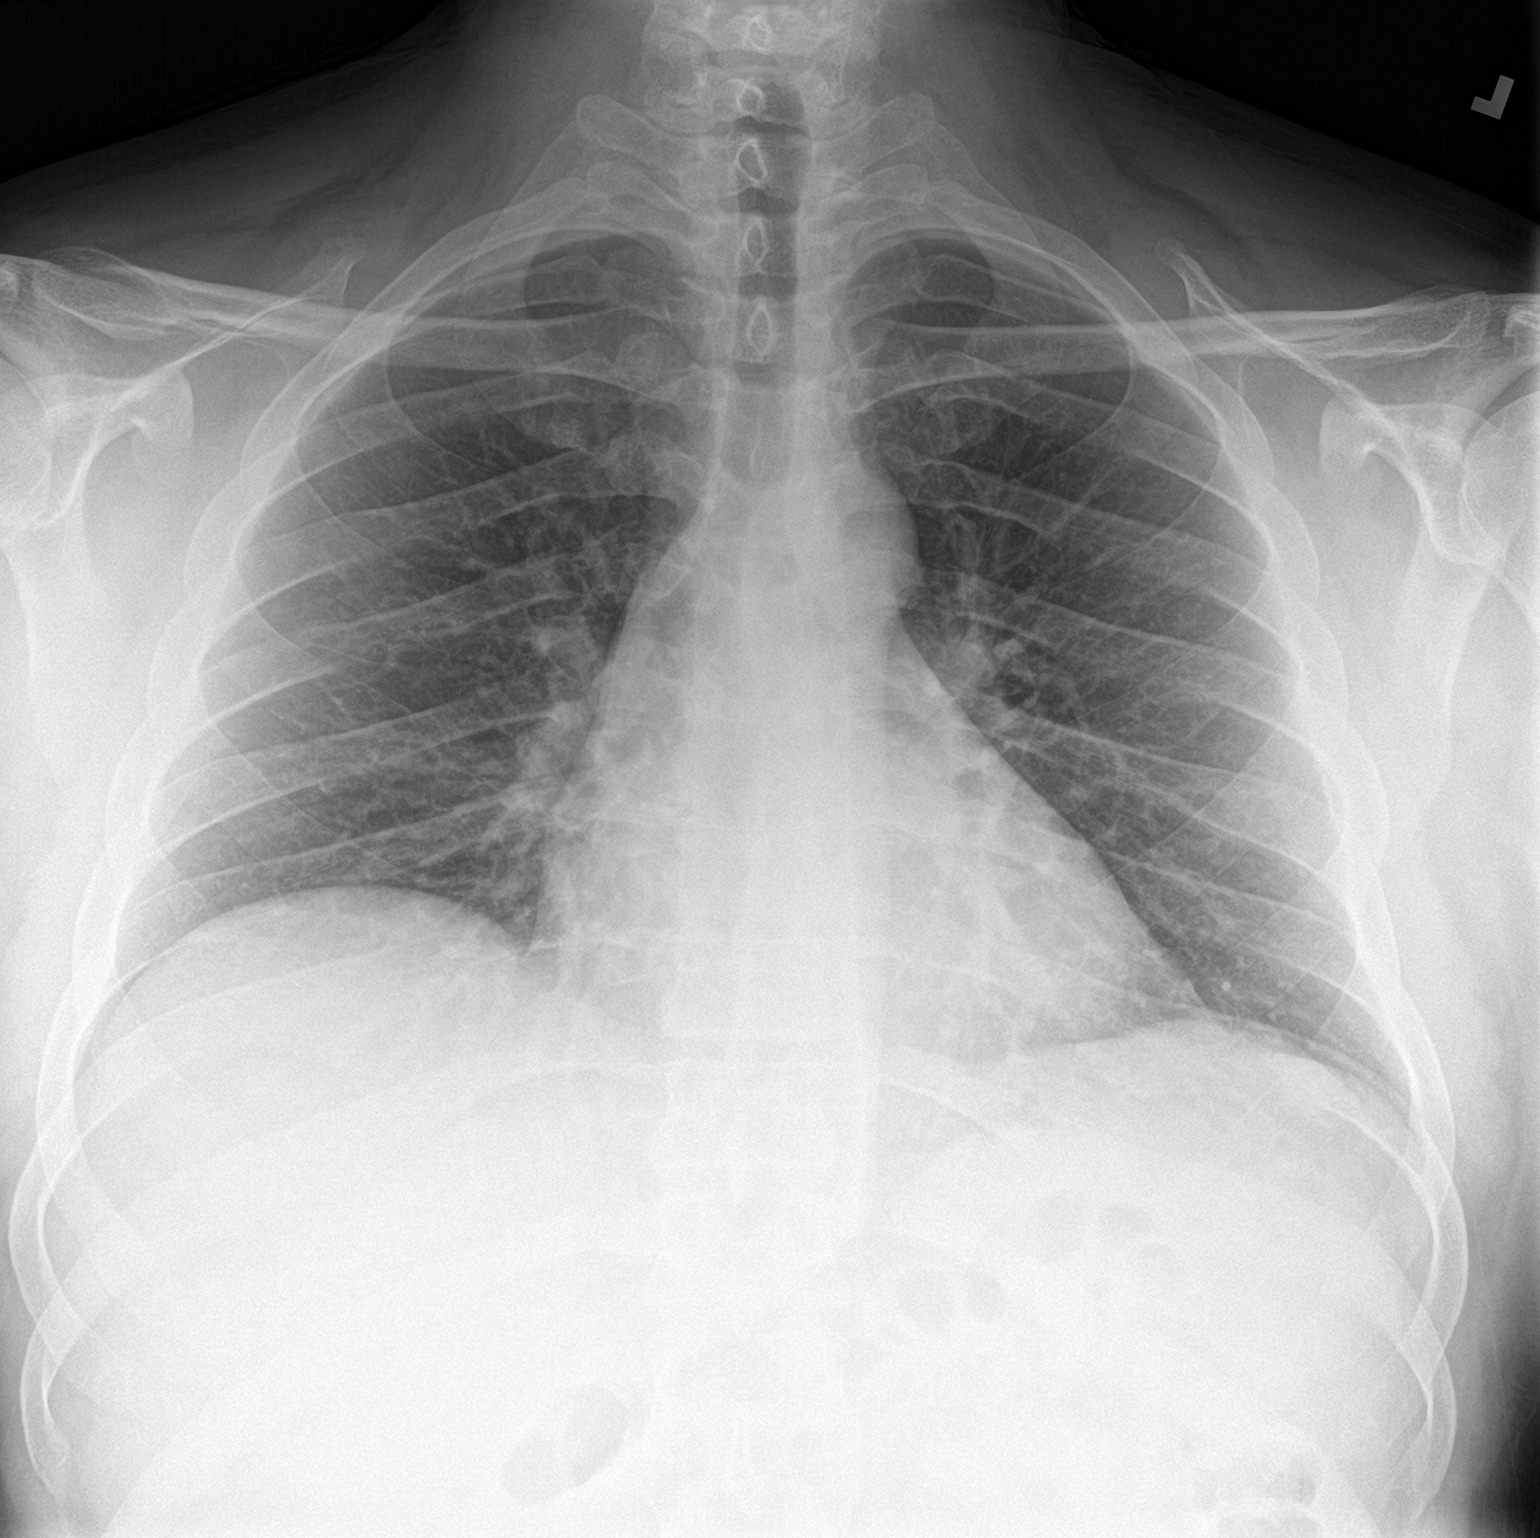

[1 of 1 positions shown; findings below may reference images not displayed]

FINDINGS: The heart size and mediastinal contours are within normal limits.
Both lungs are clear. The visualized skeletal structures are
unremarkable.
IMPRESSION: No active disease.

## 2021-02-08 DIAGNOSIS — R258 Other abnormal involuntary movements: Secondary | ICD-10-CM | POA: Diagnosis not present

## 2021-02-08 DIAGNOSIS — N529 Male erectile dysfunction, unspecified: Secondary | ICD-10-CM | POA: Diagnosis not present

## 2021-02-17 DIAGNOSIS — E291 Testicular hypofunction: Secondary | ICD-10-CM | POA: Diagnosis not present

## 2021-02-17 DIAGNOSIS — R7989 Other specified abnormal findings of blood chemistry: Secondary | ICD-10-CM | POA: Diagnosis not present

## 2021-03-19 DIAGNOSIS — Z7251 High risk heterosexual behavior: Secondary | ICD-10-CM | POA: Diagnosis not present

## 2021-03-19 DIAGNOSIS — E291 Testicular hypofunction: Secondary | ICD-10-CM | POA: Diagnosis not present

## 2023-03-10 DIAGNOSIS — R5383 Other fatigue: Secondary | ICD-10-CM | POA: Diagnosis not present

## 2023-03-10 DIAGNOSIS — R55 Syncope and collapse: Secondary | ICD-10-CM | POA: Diagnosis not present

## 2023-03-10 DIAGNOSIS — Z79899 Other long term (current) drug therapy: Secondary | ICD-10-CM | POA: Diagnosis not present

## 2023-03-10 DIAGNOSIS — M129 Arthropathy, unspecified: Secondary | ICD-10-CM | POA: Diagnosis not present

## 2023-03-10 DIAGNOSIS — Z6834 Body mass index (BMI) 34.0-34.9, adult: Secondary | ICD-10-CM | POA: Diagnosis not present

## 2023-03-10 DIAGNOSIS — E6609 Other obesity due to excess calories: Secondary | ICD-10-CM | POA: Diagnosis not present

## 2023-03-10 DIAGNOSIS — M545 Low back pain, unspecified: Secondary | ICD-10-CM | POA: Diagnosis not present

## 2023-05-10 DIAGNOSIS — Z6834 Body mass index (BMI) 34.0-34.9, adult: Secondary | ICD-10-CM | POA: Diagnosis not present

## 2023-05-10 DIAGNOSIS — K529 Noninfective gastroenteritis and colitis, unspecified: Secondary | ICD-10-CM | POA: Diagnosis not present

## 2023-05-10 DIAGNOSIS — E6609 Other obesity due to excess calories: Secondary | ICD-10-CM | POA: Diagnosis not present
